# Patient Record
Sex: Female | Born: 1995 | Race: White | Hispanic: No | Marital: Single | Smoking: Never smoker
Health system: Southern US, Community
[De-identification: ages and names within clinical notes are randomized; demographics above are authoritative.]

## PROBLEM LIST (undated history)

## (undated) DIAGNOSIS — Z789 Other specified health status: Secondary | ICD-10-CM

## (undated) HISTORY — PX: NO PAST SURGERIES: SHX2092

---
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
## 2014-02-05 ENCOUNTER — Encounter: Payer: Self-pay | Admitting: Podiatry

## 2014-02-05 ENCOUNTER — Ambulatory Visit (INDEPENDENT_AMBULATORY_CARE_PROVIDER_SITE_OTHER): Payer: BC Managed Care – PPO

## 2014-02-05 ENCOUNTER — Ambulatory Visit (INDEPENDENT_AMBULATORY_CARE_PROVIDER_SITE_OTHER): Payer: BC Managed Care – PPO | Admitting: Podiatry

## 2014-02-05 VITALS — BP 120/75 | HR 57 | Resp 14 | Ht 64.0 in | Wt 150.0 lb

## 2014-02-05 DIAGNOSIS — M722 Plantar fascial fibromatosis: Secondary | ICD-10-CM

## 2014-02-05 DIAGNOSIS — M79609 Pain in unspecified limb: Secondary | ICD-10-CM

## 2014-02-05 MED ORDER — TRIAMCINOLONE ACETONIDE 10 MG/ML IJ SUSP
10.0000 mg | Freq: Once | INTRAMUSCULAR | Status: AC
  Administered 2014-02-05: 10 mg

## 2014-02-05 MED ORDER — DICLOFENAC SODIUM 75 MG PO TBEC: 75.0000 mg | DELAYED_RELEASE_TABLET | Freq: Two times a day (BID) | ORAL | Status: DC

## 2014-02-05 NOTE — Progress Notes (Signed)
   Subjective:    Patient ID: Sherri Barton, female    DOB: [birth year], 18 y.o.   MRN: [id number]  HPI N arch pain        L left medial arch        D 4 to 5 months        O worsening        C shooting pain        A no known trigger        T rest, Ibuprofen, OTC inserts    Review of Systems  All other systems reviewed and are negative.       Objective:   Physical Exam        Assessment & Plan:

## 2014-02-05 NOTE — Patient Instructions (Signed)

## 2014-02-06 NOTE — Progress Notes (Signed)
Subjective:     Patient ID: Sherri Barton, female   DOB: [birth year], 18 y.o.   MRN: [id number]  Foot Pain   patient presents with mother stating that she has pain in her left arch that has been hurting her for about 4-5 months and gradually getting worse. Also states that she's flat-footed and that always given her trouble   Review of Systems  All other systems reviewed and are negative.       Objective:   Physical Exam  Nursing note and vitals reviewed. Constitutional: She is oriented to person, place, and time.  Cardiovascular: Intact distal pulses.   Musculoskeletal: Normal range of motion.  Neurological: She is oriented to person, place, and time.  Skin: Skin is warm.   neurovascular status is intact with muscle strength adequate and normal range of motion subtalar and midtarsal joint. Digits are well perfused and I noted there to be no equinus condition. Patient is found to have discomfort in the left mid arch area with inflammation and fluid occurring and is noted to have significant structural depression of the arch of both feet     Assessment:     Plantar fasciitis left with collapse medial longitudinal arch both feet    Plan:     H&P and x-rays reviewed. Explained condition and did careful injection mid arch area left 3 mg Kenalog 5 g I can Marcaine mixture and dispensed fascial brace. Due to young age and chronic problems we did go ahead today and scanned for custom orthotics to lift the arch. Patient will be seen back when orthotics are ready

## 2014-02-07 ENCOUNTER — Ambulatory Visit: Payer: Self-pay | Admitting: Podiatry

## 2014-02-19 ENCOUNTER — Ambulatory Visit (INDEPENDENT_AMBULATORY_CARE_PROVIDER_SITE_OTHER): Payer: BC Managed Care – PPO | Admitting: Podiatry

## 2014-02-19 ENCOUNTER — Encounter: Payer: Self-pay | Admitting: Podiatry

## 2014-02-19 VITALS — BP 120/76 | HR 60 | Resp 16

## 2014-02-19 DIAGNOSIS — M722 Plantar fascial fibromatosis: Secondary | ICD-10-CM

## 2014-02-19 NOTE — Patient Instructions (Signed)

## 2014-02-19 NOTE — Progress Notes (Signed)
Pick up orthotics , written and verbal instructions given

## 2014-02-19 NOTE — Progress Notes (Signed)
Subjective:     Patient ID: Sherri Barton, female   DOB: 1996-09-28, 18 y.o.   MRN: [id number]  HPI patient states that her feet are feeling much better with minimal discomfort or inflammation noted at the current time   Review of Systems     Objective:   Physical Exam Neurovascular status intact with inflammation of the left mid arch area with   mild discomfort when pressed deeply into the tissue Assessment:     Improved plantar fasciitis left    Plan:     Gave instructions on physical therapy and shoe gear modifications and dispensed orthotics with all instructions on usage. Reappoint in 2 weeks

## 2014-03-19 ENCOUNTER — Ambulatory Visit: Payer: BC Managed Care – PPO | Admitting: Podiatry

## 2014-03-21 ENCOUNTER — Encounter: Payer: Self-pay | Admitting: Podiatry

## 2014-03-21 ENCOUNTER — Ambulatory Visit (INDEPENDENT_AMBULATORY_CARE_PROVIDER_SITE_OTHER): Payer: BC Managed Care – PPO | Admitting: Podiatry

## 2014-03-21 VITALS — BP 119/69 | HR 67 | Resp 16

## 2014-03-21 DIAGNOSIS — M722 Plantar fascial fibromatosis: Secondary | ICD-10-CM

## 2014-03-21 NOTE — Progress Notes (Signed)
Subjective:     Patient ID: Sherri Barton, female   DOB: [birth year], 18 y.o.   MRN: [id number]  HPI patient states that the left foot is feeling better but if she's on it for long periods of time but still gets sore   Review of Systems     Objective:   Physical Exam Neurovascular status intact with discomfort in the left mid arch area that is still present but improved from previous    Assessment:     Plantar fasciitis left still present but improved    Plan:     Gave instructions on physical therapy anti-inflammatory continued orthotic usage and possible cast immobilization if symptoms persist.

## 2016-04-28 ENCOUNTER — Encounter (HOSPITAL_COMMUNITY): Payer: Self-pay | Admitting: *Deleted

## 2016-04-28 ENCOUNTER — Inpatient Hospital Stay (HOSPITAL_COMMUNITY): Payer: 59 | Admitting: Anesthesiology

## 2016-04-28 ENCOUNTER — Ambulatory Visit (HOSPITAL_COMMUNITY)
Admission: AD | Admit: 2016-04-28 | Discharge: 2016-04-28 | Disposition: A | Discharge status: Home / Self Care | Payer: 59 | Source: Ambulatory Visit | Attending: Obstetrics & Gynecology | Admitting: Obstetrics & Gynecology

## 2016-04-28 ENCOUNTER — Encounter (HOSPITAL_COMMUNITY)
Admission: AD | Disposition: A | Discharge status: Home / Self Care | Payer: Self-pay | Source: Ambulatory Visit | Attending: Obstetrics & Gynecology

## 2016-04-28 ENCOUNTER — Inpatient Hospital Stay (HOSPITAL_COMMUNITY): Payer: 59

## 2016-04-28 DIAGNOSIS — O26899 Other specified pregnancy related conditions, unspecified trimester: Secondary | ICD-10-CM

## 2016-04-28 DIAGNOSIS — O209 Hemorrhage in early pregnancy, unspecified: Secondary | ICD-10-CM | POA: Diagnosis present

## 2016-04-28 DIAGNOSIS — O00101 Right tubal pregnancy without intrauterine pregnancy: Secondary | ICD-10-CM

## 2016-04-28 DIAGNOSIS — O00109 Unspecified tubal pregnancy without intrauterine pregnancy: Secondary | ICD-10-CM

## 2016-04-28 DIAGNOSIS — O001 Tubal pregnancy without intrauterine pregnancy: Secondary | ICD-10-CM

## 2016-04-28 DIAGNOSIS — K661 Hemoperitoneum: Secondary | ICD-10-CM

## 2016-04-28 DIAGNOSIS — Z880 Allergy status to penicillin: Secondary | ICD-10-CM | POA: Insufficient documentation

## 2016-04-28 DIAGNOSIS — R109 Unspecified abdominal pain: Secondary | ICD-10-CM

## 2016-04-28 HISTORY — PX: DIAGNOSTIC LAPAROSCOPY WITH REMOVAL OF ECTOPIC PREGNANCY: SHX6449

## 2016-04-28 HISTORY — PX: LAPAROSCOPIC UNILATERAL SALPINGECTOMY: SHX5934

## 2016-04-28 HISTORY — DX: Hemoperitoneum: K66.1

## 2016-04-28 HISTORY — DX: Other specified health status: Z78.9

## 2016-04-28 LAB — HIV ANTIBODY (ROUTINE TESTING W REFLEX): HIV Screen 4th Generation wRfx: NONREACTIVE

## 2016-04-28 LAB — URINALYSIS, ROUTINE W REFLEX MICROSCOPIC
Bilirubin Urine: NEGATIVE
GLUCOSE, UA: NEGATIVE mg/dL
Ketones, ur: NEGATIVE mg/dL
Nitrite: NEGATIVE
PH: 6 (ref 5.0–8.0)
Protein, ur: NEGATIVE mg/dL
Specific Gravity, Urine: 1.025 (ref 1.005–1.030)

## 2016-04-28 LAB — HCG, QUANTITATIVE, PREGNANCY: hCG, Beta Chain, Quant, S: 2471 m[IU]/mL — ABNORMAL HIGH (ref <5)

## 2016-04-28 LAB — WET PREP, GENITAL
Sperm: NONE SEEN
TRICH WET PREP: NONE SEEN
YEAST WET PREP: NONE SEEN

## 2016-04-28 LAB — POCT PREGNANCY, URINE: Preg Test, Ur: POSITIVE — AB

## 2016-04-28 LAB — CBC
HCT: 35 % — ABNORMAL LOW (ref 36.0–46.0)
Hemoglobin: 11.4 g/dL — ABNORMAL LOW (ref 12.0–15.0)
MCH: 26.6 pg (ref 26.0–34.0)
MCHC: 32.6 g/dL (ref 30.0–36.0)
MCV: 81.6 fL (ref 78.0–100.0)
PLATELETS: 193 10*3/uL (ref 150–400)
RBC: 4.29 MIL/uL (ref 3.87–5.11)
RDW: 14 % (ref 11.5–15.5)
WBC: 5.6 10*3/uL (ref 4.0–10.5)

## 2016-04-28 LAB — URINE MICROSCOPIC-ADD ON

## 2016-04-28 LAB — ABO/RH: ABO/RH(D): A POS

## 2016-04-28 SURGERY — LAPAROSCOPY, WITH ECTOPIC PREGNANCY SURGICAL TREATMENT
Anesthesia: General | Laterality: Right

## 2016-04-28 MED ORDER — LIDOCAINE HCL (CARDIAC) 20 MG/ML IV SOLN
INTRAVENOUS | Status: AC
Start: 2016-04-28 — End: 2016-04-28
  Filled 2016-04-28: qty 5

## 2016-04-28 MED ORDER — SCOPOLAMINE 1 MG/3DAYS TD PT72
MEDICATED_PATCH | TRANSDERMAL | Status: AC
  Filled 2016-04-28: qty 1

## 2016-04-28 MED ORDER — HYDROMORPHONE HCL 1 MG/ML IJ SOLN
0.2500 mg | INTRAMUSCULAR | Status: DC | PRN
  Administered 2016-04-28: 0.25 mg via INTRAVENOUS

## 2016-04-28 MED ORDER — PROPOFOL 10 MG/ML IV BOLUS
INTRAVENOUS | Status: DC | PRN
  Administered 2016-04-28: 150 mg via INTRAVENOUS

## 2016-04-28 MED ORDER — ROCURONIUM BROMIDE 100 MG/10ML IV SOLN
INTRAVENOUS | Status: DC | PRN
  Administered 2016-04-28: 30 mg via INTRAVENOUS

## 2016-04-28 MED ORDER — MIDAZOLAM HCL 2 MG/2ML IJ SOLN
INTRAMUSCULAR | Status: AC
Start: 2016-04-28 — End: 2016-04-28
  Filled 2016-04-28: qty 2

## 2016-04-28 MED ORDER — LACTATED RINGERS IV SOLN
INTRAVENOUS | Status: DC
  Administered 2016-04-28 (×2): via INTRAVENOUS

## 2016-04-28 MED ORDER — DEXAMETHASONE SODIUM PHOSPHATE 10 MG/ML IJ SOLN
INTRAMUSCULAR | Status: AC
  Filled 2016-04-28: qty 1

## 2016-04-28 MED ORDER — LIDOCAINE HCL (CARDIAC) 20 MG/ML IV SOLN
INTRAVENOUS | Status: DC | PRN
  Administered 2016-04-28: 60 mg via INTRAVENOUS

## 2016-04-28 MED ORDER — FENTANYL CITRATE (PF) 250 MCG/5ML IJ SOLN
INTRAMUSCULAR | Status: DC | PRN
  Administered 2016-04-28: 100 ug via INTRAVENOUS
  Administered 2016-04-28 (×2): 50 ug via INTRAVENOUS

## 2016-04-28 MED ORDER — SUCCINYLCHOLINE CHLORIDE 20 MG/ML IJ SOLN
INTRAMUSCULAR | Status: DC | PRN
  Administered 2016-04-28: 100 mg via INTRAVENOUS

## 2016-04-28 MED ORDER — NEOSTIGMINE METHYLSULFATE 10 MG/10ML IV SOLN
INTRAVENOUS | Status: AC
  Filled 2016-04-28: qty 1

## 2016-04-28 MED ORDER — PROPOFOL 10 MG/ML IV BOLUS
INTRAVENOUS | Status: AC
  Filled 2016-04-28: qty 20

## 2016-04-28 MED ORDER — HYDROCODONE-ACETAMINOPHEN 7.5-325 MG PO TABS: 1.0000 | ORAL_TABLET | Freq: Once | ORAL | Status: DC | PRN

## 2016-04-28 MED ORDER — FENTANYL CITRATE (PF) 100 MCG/2ML IJ SOLN: INTRAMUSCULAR | Status: DC | PRN

## 2016-04-28 MED ORDER — OXYCODONE-ACETAMINOPHEN 5-325 MG PO TABS: 1.0000 | ORAL_TABLET | Freq: Four times a day (QID) | ORAL | Status: DC | PRN

## 2016-04-28 MED ORDER — GLYCOPYRROLATE 0.2 MG/ML IJ SOLN
INTRAMUSCULAR | Status: AC
  Filled 2016-04-28: qty 4

## 2016-04-28 MED ORDER — ONDANSETRON HCL 4 MG/2ML IJ SOLN
INTRAMUSCULAR | Status: AC
  Filled 2016-04-28: qty 2

## 2016-04-28 MED ORDER — DEXAMETHASONE SODIUM PHOSPHATE 4 MG/ML IJ SOLN
INTRAMUSCULAR | Status: DC | PRN
  Administered 2016-04-28: 10 mg via INTRAVENOUS

## 2016-04-28 MED ORDER — HYDROMORPHONE HCL 1 MG/ML IJ SOLN
INTRAMUSCULAR | Status: DC | PRN
  Administered 2016-04-28: 1 mg via INTRAVENOUS

## 2016-04-28 MED ORDER — HYDROMORPHONE HCL 1 MG/ML IJ SOLN
1.0000 mg | Freq: Once | INTRAMUSCULAR | Status: AC
  Administered 2016-04-28: 1 mg via INTRAMUSCULAR
  Filled 2016-04-28: qty 1

## 2016-04-28 MED ORDER — PROMETHAZINE HCL 25 MG/ML IJ SOLN: 6.2500 mg | INTRAMUSCULAR | Status: DC | PRN

## 2016-04-28 MED ORDER — SOD CITRATE-CITRIC ACID 500-334 MG/5ML PO SOLN
30.0000 mL | ORAL | Status: AC
  Administered 2016-04-28: 30 mL via ORAL

## 2016-04-28 MED ORDER — BUPIVACAINE HCL (PF) 0.25 % IJ SOLN
INTRAMUSCULAR | Status: DC | PRN
  Administered 2016-04-28: 10 mL

## 2016-04-28 MED ORDER — ROCURONIUM BROMIDE 100 MG/10ML IV SOLN
INTRAVENOUS | Status: AC
  Filled 2016-04-28: qty 1

## 2016-04-28 MED ORDER — KETOROLAC TROMETHAMINE 30 MG/ML IJ SOLN: 30.0000 mg | Freq: Once | INTRAMUSCULAR | Status: DC | PRN

## 2016-04-28 MED ORDER — HYDROMORPHONE HCL 1 MG/ML IJ SOLN
INTRAMUSCULAR | Status: AC
  Filled 2016-04-28: qty 1

## 2016-04-28 MED ORDER — IBUPROFEN 600 MG PO TABS: 600.0000 mg | ORAL_TABLET | Freq: Four times a day (QID) | ORAL | Status: DC | PRN

## 2016-04-28 MED ORDER — SCOPOLAMINE 1 MG/3DAYS TD PT72
MEDICATED_PATCH | TRANSDERMAL | Status: DC | PRN
  Administered 2016-04-28: 1 via TRANSDERMAL

## 2016-04-28 MED ORDER — GLYCOPYRROLATE 0.2 MG/ML IJ SOLN
INTRAMUSCULAR | Status: DC | PRN
  Administered 2016-04-28: 0.1 mg via INTRAVENOUS
  Administered 2016-04-28: .8 mg via INTRAVENOUS

## 2016-04-28 MED ORDER — SOD CITRATE-CITRIC ACID 500-334 MG/5ML PO SOLN
ORAL | Status: AC
  Filled 2016-04-28: qty 15

## 2016-04-28 MED ORDER — FENTANYL CITRATE (PF) 250 MCG/5ML IJ SOLN
INTRAMUSCULAR | Status: AC
  Filled 2016-04-28: qty 5

## 2016-04-28 MED ORDER — MIDAZOLAM HCL 2 MG/2ML IJ SOLN
INTRAMUSCULAR | Status: DC | PRN
  Administered 2016-04-28: 2 mg via INTRAVENOUS

## 2016-04-28 MED ORDER — NEOSTIGMINE METHYLSULFATE 10 MG/10ML IV SOLN
INTRAVENOUS | Status: DC | PRN
  Administered 2016-04-28: 4 mg via INTRAVENOUS

## 2016-04-28 SURGICAL SUPPLY — 27 items
CABLE HIGH FREQUENCY MONO STRZ (ELECTRODE) IMPLANT
CLOSURE WOUND 1/2 X4 (GAUZE/BANDAGES/DRESSINGS)
CLOTH BEACON ORANGE TIMEOUT ST (SAFETY) ×5 IMPLANT
DRSG COVADERM PLUS 2X2 (GAUZE/BANDAGES/DRESSINGS) ×4 IMPLANT
DRSG OPSITE POSTOP 3X4 (GAUZE/BANDAGES/DRESSINGS) ×6 IMPLANT
DURAPREP 26ML APPLICATOR (WOUND CARE) ×5 IMPLANT
GLOVE BIO SURGEON STRL SZ 6.5 (GLOVE) ×4 IMPLANT
GLOVE BIO SURGEONS STRL SZ 6.5 (GLOVE) ×1
GLOVE BIOGEL PI IND STRL 7.0 (GLOVE) ×8 IMPLANT
GLOVE BIOGEL PI INDICATOR 7.0 (GLOVE) ×8
GOWN STRL REUS W/TWL LRG LVL3 (GOWN DISPOSABLE) ×10 IMPLANT
LIQUID BAND (GAUZE/BANDAGES/DRESSINGS) ×3 IMPLANT
NEEDLE INSUFFLATION 120MM (ENDOMECHANICALS) ×5 IMPLANT
NS IRRIG 1000ML POUR BTL (IV SOLUTION) ×5 IMPLANT
PACK LAPAROSCOPY BASIN (CUSTOM PROCEDURE TRAY) ×5 IMPLANT
PAD TRENDELENBURG POSITION (MISCELLANEOUS) ×5 IMPLANT
SET IRRIG TUBING LAPAROSCOPIC (IRRIGATION / IRRIGATOR) ×3 IMPLANT
SHEARS HARMONIC ACE PLUS 36CM (ENDOMECHANICALS) ×3 IMPLANT
SLEEVE XCEL OPT CAN 5 100 (ENDOMECHANICALS) ×5 IMPLANT
STRIP CLOSURE SKIN 1/2X4 (GAUZE/BANDAGES/DRESSINGS) IMPLANT
SUT VICRYL 0 UR6 27IN ABS (SUTURE) ×5 IMPLANT
SUT VICRYL 4-0 PS2 18IN ABS (SUTURE) ×5 IMPLANT
TOWEL OR 17X24 6PK STRL BLUE (TOWEL DISPOSABLE) ×10 IMPLANT
TRAY FOLEY BAG SILVER LF 16FR (SET/KITS/TRAYS/PACK) ×5 IMPLANT
TROCAR XCEL DIL TIP R 11M (ENDOMECHANICALS) ×5 IMPLANT
WARMER LAPAROSCOPE (MISCELLANEOUS) ×5 IMPLANT
WATER STERILE IRR 1000ML POUR (IV SOLUTION) ×2 IMPLANT

## 2016-04-28 NOTE — Anesthesia Postprocedure Evaluation (Signed)
Anesthesia Post Note  Patient: Sherri Barton  Procedure(s) Performed: Procedure(s) (LRB): DIAGNOSTIC LAPAROSCOPY WITH REMOVAL OF RIGHT ECTOPIC PREGNANCY (Right) LAPAROSCOPIC RIGHT SALPINGECTOMY  Patient location during evaluation: PACU Anesthesia Type: General Level of consciousness: awake and alert and oriented Pain management: pain level controlled Vital Signs Assessment: post-procedure vital signs reviewed and stable Respiratory status: spontaneous breathing, nonlabored ventilation and respiratory function stable Cardiovascular status: blood pressure returned to baseline and stable Postop Assessment: no signs of nausea or vomiting Anesthetic complications: no     Last Vitals:  Filed Vitals:   04/28/16 1300 04/28/16 1315  BP: 122/63 115/62  Pulse: 72 65  Temp:    Resp: 20 15    Last Pain:  Filed Vitals:   04/28/16 1319  PainSc: 2    Pain Goal: Patients Stated Pain Goal: 3 (04/28/16 1315)               Edwardine Deschepper A.

## 2016-04-28 NOTE — Op Note (Signed)
Sherri Barton PROCEDURE DATE: 04/28/2016  PREOPERATIVE DIAGNOSIS: Ruptured ectopic pregnancy POSTOPERATIVE DIAGNOSIS: Ruptured right fallopian tube ectopic pregnancy PROCEDURE: Laparoscopic right salpingectomy and removal of ectopic pregnancy SURGEON:  Adam PhenixJames G Arnold, MD  ANESTHESIOLOGIST: Marcene Duosobert Fitzgerald, MD Anesthesiologist: Marcene Duosobert Fitzgerald, MD CRNA: Graciela HusbandsWynn O Fussell, CRNA  INDICATIONS: 20 y.o. G1P0 at 59107w2d here with the preoperative diagnoses as listed above.  Please refer to preoperative notes for more details. Patient was counseled regarding need for laparoscopic salpingectomy. Risks of surgery including bleeding which may require transfusion or reoperation, infection, injury to bowel or other surrounding organs, need for additional procedures including laparotomy and other postoperative/anesthesia complications were explained to patient.  Written informed consent was obtained.  FINDINGS:  moderate amount of hemoperitoneum estimated to be about 100 of blood and clots.  Dilated right fallopian tube containing ectopic gestation. Small normal appearing uterus, normal left fallopian tube, right ovary and left ovary.  ANESTHESIA: General INTRAVENOUS FLUIDS: 1500 ml ESTIMATED BLOOD LOSS: 100 ml URINE OUTPUT: 100 ml SPECIMENS: Right fallopian tube containing ectopic gestation COMPLICATIONS: None immediate  PROCEDURE IN DETAIL:  The patient was taken to the operating room where general anesthesia was administered and was found to be adequate.  She was placed in the dorsal lithotomy position, and was prepped and draped in a sterile manner.  A Foley catheter was inserted into her bladder and attached to constant drainage and a uterine manipulator was then advanced into the uterus .    After an adequate timeout was performed, attention was turned to the abdomen where an umbilical incision was made with the scalpel.  The Optiview 11-mm trocar and sleeve were then advanced without difficulty with  the laparoscope under direct visualization into the abdomen.  The abdomen was then insufflated with carbon dioxide gas and adequate pneumoperitoneum was obtained.  A survey of the patient's pelvis and abdomen revealed the findings above.  Two 5-mm left lower quadrant ports were then placed under direct visualization.  The Nezhat suction irrigator was then used to suction the hemoperitoneum and irrigate the pelvis.  The Swollen right fallopina tube was opened with the harmonic scalpel and clot and possible tissue was removed and harvested with the Endocatch. The tube was inspected and continued bleeding was seen and I could not confirm that all tissue was removed. Attention was then turned to the right fallopian tube which was grasped and ligated from the underlying mesosalpinx and uterine attachment using the Harmonic instrument.  Good hemostasis was noted.  The specimen was placed in an EndoCatch bag and removed from the abdomen intact.  The abdomen was desufflated, and all instruments were removed.  The fascial incision of the 10-mm site was reapproximated with a 0 Vicryl figure-of-eight stitch and all skin incisions were closed with Dermabond. The patient tolerated the procedure well.  All instruments, needles, and sponge counts were correct x 2. The patient was taken to the recovery room in stable condition.   The patient will be discharged to home as per PACU criteria.  Routine postoperative instructions given.  She was prescribed Percocet, Ibuprofen.  She will follow up in the clinic in about 2-3 weeks for postoperative evaluation.   Adam PhenixJames G Arnold, MD 04/28/2016 12:36 PM

## 2016-04-28 NOTE — MAU Provider Note (Signed)
History     CSN: [id number]  Arrival date and time: 04/28/16 8295   First Provider Initiated Contact with Patient 04/28/16 [phone]       Chief Complaint  Patient presents with  . Vaginal Bleeding  . Abdominal Cramping   HPI  Sherri Barton is a 20 y.o. G1P0 at [redacted]w[redacted]d by LMP who presents with abdominal pain & vaginal bleeding. Reports lower abdominal pain x 5 days; worse this morning. Rates pain 8/10. No treatment.  Vaginal bleeding x 2 weeks. States it's more than spotting but not as heavy as menses.  Some nausea, no vomiting.  Denies fever, dysuria, vaginal discharge, diarrhea, or constipation.   OB History    Gravida Para Term Preterm AB TAB SAB Ectopic Multiple Living   1               Past Medical History  Diagnosis Date  . Medical history non-contributory     Past Surgical History  Procedure Laterality Date  . No past surgeries      History reviewed. No pertinent family history.  Social History  Substance Use Topics  . Smoking status: Never Smoker   . Smokeless tobacco: Never Used  . Alcohol Use: No    Allergies:  Allergies  Allergen Reactions  . Augmentin [Amoxicillin-Pot Clavulanate] Rash    Prescriptions prior to admission  Medication Sig Dispense Refill Last Dose  . diclofenac (VOLTAREN) 75 MG EC tablet Take 1 tablet (75 mg total) by mouth 2 (two) times daily. 50 tablet 2   . Norgestimate-Ethinyl Estradiol Triphasic (TRI-PREVIFEM) 0.18/0.215/0.25 MG-35 MCG tablet Take 1 tablet by mouth daily.   Taking    Review of Systems  Constitutional: Negative.   Gastrointestinal: Positive for nausea and abdominal pain. Negative for vomiting, diarrhea and constipation.  Genitourinary: Negative for dysuria.       + vaginal bleeding  Neurological: Negative for dizziness.   Physical Exam   Blood pressure 121/73, pulse 95, temperature 98 F (36.7 C), temperature source Oral, resp. rate 18, height 5' 4.5" (1.638 m), weight 174 lb 9.6 oz (79.198 kg), last  menstrual period 03/08/2016.  Physical Exam  Nursing note and vitals reviewed. Constitutional: She is oriented to person, place, and time. She appears well-developed and well-nourished. No distress.  HENT:  Head: Normocephalic and atraumatic.  Eyes: Conjunctivae are normal. Right eye exhibits no discharge. Left eye exhibits no discharge. No scleral icterus.  Neck: Normal range of motion.  Cardiovascular: Normal rate, regular rhythm and normal heart sounds.   No murmur heard. Respiratory: Effort normal and breath sounds normal. No respiratory distress. She has no wheezes.  GI: Soft. There is tenderness in the suprapubic area. There is no rigidity and no rebound.  Genitourinary: Uterus is tender. Cervix exhibits no motion tenderness. There is bleeding (minimal amount of dark red blood) in the vagina.  Cervix closed  Neurological: She is alert and oriented to person, place, and time.  Skin: Skin is warm and dry. She is not diaphoretic.  Psychiatric: She has a normal mood and affect. Her behavior is normal. Judgment and thought content normal.    MAU Course  Procedures Results for orders placed or performed during the hospital encounter of 04/28/16 (from the past 24 hour(s))  Urinalysis, Routine w reflex microscopic (not at [facility])     Status: Abnormal   Collection Time: 04/28/16  7:55 AM  Result Value Ref Range   Color, Urine YELLOW YELLOW   APPearance CLEAR CLEAR   Specific Gravity,  Urine 1.025 1.005 - 1.030   pH 6.0 5.0 - 8.0   Glucose, UA NEGATIVE NEGATIVE mg/dL   Hgb urine dipstick LARGE (A) NEGATIVE   Bilirubin Urine NEGATIVE NEGATIVE   Ketones, ur NEGATIVE NEGATIVE mg/dL   Protein, ur NEGATIVE NEGATIVE mg/dL   Nitrite NEGATIVE NEGATIVE   Leukocytes, UA TRACE (A) NEGATIVE  Urine microscopic-add on     Status: Abnormal   Collection Time: 04/28/16  7:55 AM  Result Value Ref Range   Squamous Epithelial / LPF 0-5 (A) NONE SEEN   WBC, UA 0-5 0 - 5 WBC/hpf   RBC / HPF 0-5 0 - 5  RBC/hpf   Bacteria, UA MANY (A) NONE SEEN   Urine-Other LESS THAN 10 mL OF URINE SUBMITTED   Pregnancy, urine POC     Status: Abnormal   Collection Time: 04/28/16  7:57 AM  Result Value Ref Range   Preg Test, Ur POSITIVE (A) NEGATIVE  CBC     Status: Abnormal   Collection Time: 04/28/16  8:39 AM  Result Value Ref Range   WBC 5.6 4.0 - 10.5 K/uL   RBC 4.29 3.87 - 5.11 MIL/uL   Hemoglobin 11.4 (L) 12.0 - 15.0 g/dL   HCT 16.1 (L) 09.6 - 04.5 %   MCV 81.6 78.0 - 100.0 fL   MCH 26.6 26.0 - 34.0 pg   MCHC 32.6 30.0 - 36.0 g/dL   RDW 40.9 81.1 - 91.4 %   Platelets 193 150 - 400 K/uL  ABO/Rh     Status: None (Preliminary result)   Collection Time: 04/28/16  8:39 AM  Result Value Ref Range   ABO/RH(D) A POS   hCG, quantitative, pregnancy     Status: Abnormal   Collection Time: 04/28/16  8:40 AM  Result Value Ref Range   hCG, Beta Chain, Quant, S 2471 (H) <5 mIU/mL  Wet prep, genital     Status: Abnormal   Collection Time: 04/28/16  8:43 AM  Result Value Ref Range   Yeast Wet Prep HPF POC NONE SEEN NONE SEEN   Trich, Wet Prep NONE SEEN NONE SEEN   Clue Cells Wet Prep HPF POC PRESENT (A) NONE SEEN   WBC, Wet Prep HPF POC FEW (A) NONE SEEN   Sperm NONE SEEN    US Ob Comp Less 14 Wks  04/28/2016  CLINICAL DATA:  BILATERAL abdominal pain and light bleeding for 9 days, increasing. History of diabetes mellitus, hypertension, abdominal pain and rectal pregnancy ; EGA [redacted] weeks 2 days by LMP of 03/08/2016. No quantitative hCG yet available for correlation EXAM: OBSTETRIC <14 WK [place] AND TRANSVAGINAL OB US TECHNIQUE: Both transabdominal and transvaginal ultrasound examinations were performed for complete evaluation of the gestation as well as the maternal uterus, adnexal regions, and pelvic cul-de-sac. Transvaginal technique was performed to assess early pregnancy. COMPARISON:  None FINDINGS: Intrauterine gestational sac: Not identified Yolk sac:  N/A Embryo:  N/A Cardiac Activity: N/A Heart Rate: N/A   bpm Subchorionic hemorrhage:  N/A Maternal uterus/adnexae: Uterus normal appearance without mass or fluid collection. Heterogeneity of endometrial complex without identification by gestational sac or discrete fluid. Moderate free pelvic fluid, minimally complicated in appearance. LEFT ovary normal size and morphology, 2.9 x 2.7 x 2.0 cm. RIGHT ovary normal size and morphology 3.9 x 2.0 x 2.6 cm Superior to and separate from RIGHT ovary is a heterogeneous mass 4.8 x 2.8 x 3.2 cm in size cannot exclude ectopic pregnancy. No additional pelvic masses. IMPRESSION: No intrauterine gestation  identified. 4.8 x 2.8 x 3.2 cm diameter heterogeneous mass in RIGHT adnexa separate from the RIGHT ovary, cannot exclude ectopic pregnancy. Findings called to Kasim Mccorkle NP in MAU on 04/28/2016 at 0954 hours. Electronically Signed   Judeth HornBy: Ulyses SouthwardMark  Boles M.D.   On: 04/28/2016 09:57   [place] Ob Transvaginal  04/28/2016  CLINICAL DATA:  BILATERAL abdominal pain and light bleeding for 9 days, increasing. History of diabetes mellitus, hypertension, abdominal pain and rectal pregnancy ; EGA [redacted] weeks 2 days by LMP of 03/08/2016. No quantitative hCG yet available for correlation EXAM: OBSTETRIC <14 WK US AND TRANSVAGINAL OB US TECHNIQUE: Both transabdominal and transvaginal ultrasound examinations were performed for complete evaluation of the gestation as well as the maternal uterus, adnexal regions, and pelvic cul-de-sac. Transvaginal technique was performed to assess early pregnancy. COMPARISON:  None FINDINGS: Intrauterine gestational sac: Not identified Yolk sac:  N/A Embryo:  N/A Cardiac Activity: N/A Heart Rate: N/A  bpm Subchorionic hemorrhage:  N/A Maternal uterus/adnexae: Uterus normal appearance without mass or fluid collection. Heterogeneity of endometrial complex without identification by gestational sac or discrete fluid. Moderate free pelvic fluid, minimally complicated in appearance. LEFT ovary normal size and morphology, 2.9 x 2.7  x 2.0 cm. RIGHT ovary normal size and morphology 3.9 x 2.0 x 2.6 cm Superior to and separate from RIGHT ovary is a heterogeneous mass 4.8 x 2.8 x 3.2 cm in size cannot exclude ectopic pregnancy. No additional pelvic masses. IMPRESSION: No intrauterine gestation identified. 4.8 x 2.8 x 3.2 cm diameter heterogeneous mass in RIGHT adnexa separate from the RIGHT ovary, cannot exclude ectopic pregnancy. Findings called to Judeth HornErin Luisana Lutzke NP in MAU on 04/28/2016 at 0954 hours. Electronically Signed   By: Ulyses SouthwardMark  Boles M.D.   On: 04/28/2016 09:57    MDM UPT positive A positive CBC, BHCG, HIV, GC/CT, wet prep Dilaudid 1 mg IM given prior to going to ultrasound Ultrasound -- no IUP, right adnexal mass measuring 4.8 cm, moderate free fluid BHCG 2471 Discussed results with Dr. Debroah LoopArnold who will come see patient & discuss treatment plan  Assessment and Plan  A:  1. Tubal pregnancy without intrauterine pregnancy   2. Abdominal pain affecting pregnancy      Judeth Hornrin Nature Vogelsang 04/28/2016, 8:10 AM

## 2016-04-28 NOTE — Transfer of Care (Signed)
Immediate Anesthesia Transfer of Care Note  Patient: Sherri Barton  Procedure(s) Performed: Procedure(s): DIAGNOSTIC LAPAROSCOPY WITH REMOVAL OF RIGHT ECTOPIC PREGNANCY (Right) LAPAROSCOPIC RIGHT SALPINGECTOMY  Patient Location: PACU  Anesthesia Type:General  Level of Consciousness: awake, alert  and oriented  Airway & Oxygen Therapy: Patient Spontanous Breathing and Patient connected to nasal cannula oxygen  Post-op Assessment: Report given to RN and Post -op Vital signs reviewed and stable  Post vital signs: Reviewed and stable  Last Vitals:  Filed Vitals:   04/28/16 0748  BP: 121/73  Pulse: 95  Temp: 36.7 C  Resp: 18    Last Pain:  Filed Vitals:   04/28/16 0805  PainSc: 7          Complications: No apparent anesthesia complications

## 2016-04-28 NOTE — Discharge Instructions (Signed)
Ruptured Ectopic Pregnancy An ectopic pregnancy is when the fertilized egg attaches (implants) outside the uterus. Most ectopic pregnancies occur in the fallopian tube. Rarely do ectopic pregnancies occur on the ovary, intestine, pelvis, or cervix. An ectopic pregnancy does not have the ability to develop into a normal, healthy baby.  A ruptured ectopic pregnancy is one in which the fallopian tube gets torn or bursts and results in internal bleeding. Often there is intense abdominal pain, and sometimes, vaginal bleeding. Having an ectopic pregnancy can be a life-threatening experience. If left untreated, this dangerous condition can lead to a blood transfusion, abdominal surgery, or even death.  CAUSES  Damage to the fallopian tubes is the suspected cause in most ectopic pregnancies.  RISK FACTORS  Salpingectomy, Care After Refer to this sheet in the next few weeks. These instructions provide you with information on caring for yourself after your procedure. Your health care provider may also give you more specific instructions. Your treatment has been planned according to current medical practices, but problems sometimes occur. Call your health care provider if you have any problems or questions after your procedure. WHAT TO EXPECT AFTER THE PROCEDURE After your procedure, it is typical to have the following: Abdominal pain that can be controlled with pain medicine. Vaginal spotting. Tiredness. HOME CARE INSTRUCTIONS Get plenty of rest and sleep. Only take over-the-counter or prescription medicines as directed by your health care provider. Keep incision areas clean and dry. Remove or change any bandages (dressings) only as directed by your health care provider. You may resume your regular diet. Eat a well-balanced diet. Drink enough fluids to keep your urine clear or pale yellow. Limit exercise and activities as directed by your health care provider. Do not lift anything heavier than 5 lb (2.3  kg) until your health care provider approves. Do not drive until your health care provider approves. Do not have sexual intercourse until your health care provider says it is okay. Take your temperature twice a day for the first week. Write those temperatures down. Follow up with your health care provider as directed. SEEK MEDICAL CARE IF: You have pain when you urinate. You see pus coming out of the incision, or the incision is separating. You have increasing abdominal pain. You have swelling or redness in the incision area. You develop a rash. You feel lightheaded. You have pain that is not controlled with medicine. SEEK IMMEDIATE MEDICAL CARE IF: You develop a fever. You have increasing abdominal pain. You develop chest or leg pain. You develop shortness of breath. You pass out.   This information is not intended to replace advice given to you by your health care provider. Make sure you discuss any questions you have with your health care provider.   Document Released: 02/06/2011 Document Revised: 11/23/2014 Document Reviewed: 04/26/2013 Elsevier Interactive Patient Education [employer]2016 Elsevier Inc.  Depending on your circumstances, the amount of risk of having an ectopic pregnancy will vary. There are 3 categories that may help you identify whether you are potentially at risk. High Risk  You have gone through infertility treatment.  You have had a previous ectopic pregnancy.  You have had previous tubal surgery.  You have had previous surgery to have the fallopian tubes tied (tubal ligation).  You have tubal problems or diseases.  You have been exposed to DES. DES is a medicine that was used until 1971 and had effects on babies whose mothers took the medicine.  You become pregnant while using an intrauterine device (IUD)  for birth control. Moderate Risk  You have a history of infertility.  You have a history of a sexually transmitted infection (STI).  You have a  history of pelvic inflammatory disease (PID).  You have scarring from endometriosis.  You have multiple sexual partners.  You smoke. Low Risk  You have had previous pelvic surgery.  You use vaginal douching.  You became sexually active before 22 years of age. SYMPTOMS An ectopic pregnancy should be suspected in anyone who has missed a period and has abdominal pain or bleeding.  You may experience normal pregnancy symptoms, such as:  Nausea.  Tiredness.  Breast tenderness.  Symptoms that are not normal include:  Pain with intercourse.  Irregular vaginal bleeding or spotting.  Cramping or pain on one side, or in the lower abdomen.  Fast heartbeat.  Passing out while having a bowel movement.  Symptoms of a ruptured ectopic pregnancy and internal bleeding may include:  Sudden, severe pain in the abdomen and pelvis.  Dizziness or fainting.  Pain in the shoulder area. DIAGNOSIS  Tests that may be performed include:  A pregnancy test.  An ultrasound.  Testing the specific level of pregnancy hormone in the bloodstream.  Taking a sample of uterus tissue (dilation and curettage, D&C).  Surgery to perform a visual exam of the inside of the abdomen using a lighted tube (laparoscopy). TREATMENT  Laparoscopic surgery or abdominal surgery is recommended for a ruptured ectopic pregnancy.   The whole fallopian tube may need to be removed (salpingectomy).  If the tube is not too damaged, the tube may be saved, and the pregnancy will be surgically removed. Intime, the tube may still function.  If you have lost a lot of blood, you may need a blood transfusion.  You may receive a Rho (D) immune globulin shot if you are Rh negative and the father is Rh positive, or if you do not know the Rh type of the father. This is to prevent problems with any future pregnancy. SEEK IMMEDIATE MEDICAL CARE IF:  You have any symptoms of an ectopic or ruptured ectopic pregnancy. This is  a medical emergency. MAKE SURE YOU:  Understand these instructions.  Will watch your condition.  Will get help right away if you are not doing well or get worse.   This information is not intended to replace advice given to you by your health care provider. Make sure you discuss any questions you have with your health care provider.   Document Released: 10/30/2000 Document Revised: 11/07/2013 Document Reviewed: 08/14/2013 Elsevier Interactive Patient Education 2016 Elsevier Inc.  DISCHARGE INSTRUCTIONS: Laparoscopy  The following instructions have been prepared to help you care for yourself upon your return home today.  Wound care:  Do not get the incision wet for the first 24 hours. The incision should be kept clean and dry.  The Band-Aids or dressings may be removed the day after surgery.  Should the incision become sore, red, and swollen after the first week, check with your doctor.  Personal hygiene:  Shower the day after your procedure.  Activity and limitations:  Do NOT drive or operate any equipment today.  Do NOT lift anything more than 15 pounds for 2-3 weeks after surgery.  Do NOT rest in bed all day.  Walking is encouraged. Walk each day, starting slowly with 5-minute walks 3 or 4 times a day. Slowly increase the length of your walks.  Walk up and down stairs slowly.  Do NOT do strenuous activities,  such as golfing, playing tennis, bowling, running, biking, weight lifting, gardening, mowing, or vacuuming for 2-4 weeks. Ask your doctor when it is okay to start.  Diet: Eat a light meal as desired this evening. You may resume your usual diet tomorrow.  Return to work: This is dependent on the type of work you do. For the most part you can return to a desk job within a week of surgery. If you are more active at work, please discuss this with your doctor.  What to expect after your surgery: You may have a slight burning sensation when you urinate on the first  day. You may have a very small amount of blood in the urine. Expect to have a small amount of vaginal discharge/light bleeding for 1-2 weeks. It is not unusual to have abdominal soreness and bruising for up to 2 weeks. You may be tired and need more rest for about 1 week. You may experience shoulder pain for 24-72 hours. Lying flat in bed may relieve it.  You may remove the patch behind your left ear on or before 05/01/16. Wash your hands with soap and water after contact with the patch.  Call your doctor for any of the following:  Develop a fever of 100.4 or greater  Inability to urinate 6 hours after discharge from hospital  Severe pain not relieved by pain medications  Persistent of heavy bleeding at incision site  Redness or swelling around incision site after a week  Increasing nausea or vomiting  Patient Signature________________________________________ Nurse Signature_________________________________________

## 2016-04-28 NOTE — Anesthesia Procedure Notes (Signed)
Procedure Name: Intubation Date/Time: 04/28/2016 11:16 AM Performed by: Graciela HusbandsFUSSELL, Davon Folta O Pre-anesthesia Checklist: Patient identified, Timeout performed, Emergency Drugs available, Suction available and Patient being monitored Patient Re-evaluated:Patient Re-evaluated prior to inductionOxygen Delivery Method: Circle system utilized Preoxygenation: Pre-oxygenation with 100% oxygen Intubation Type: IV induction and Cricoid Pressure applied Ventilation: Mask ventilation without difficulty Laryngoscope Size: Mac and 3 Grade View: Grade I Tube type: Oral Tube size: 7.0 mm Number of attempts: 1 Airway Equipment and Method: Patient positioned with wedge pillow and Stylet Placement Confirmation: ETT inserted through vocal cords under direct vision,  positive ETCO2 and breath sounds checked- equal and bilateral Secured at: 21 cm Tube secured with: Tape Dental Injury: Teeth and Oropharynx as per pre-operative assessment

## 2016-04-28 NOTE — MAU Note (Signed)
Urine in lab 

## 2016-04-28 NOTE — Anesthesia Preprocedure Evaluation (Addendum)
Anesthesia Evaluation  Patient identified by MRN, date of birth, ID band Patient awake    Reviewed: Allergy & Precautions, NPO status , Patient's Chart, lab work & pertinent test results  Airway Mallampati: II  TM Distance: >3 FB Neck ROM: Full    Dental  (+) Dental Advisory Given   Pulmonary neg pulmonary ROS,    breath sounds clear to auscultation       Cardiovascular negative cardio ROS Normal cardiovascular exam Rhythm:Regular Rate:Normal     Neuro/Psych negative neurological ROS     GI/Hepatic negative GI ROS, Neg liver ROS,   Endo/Other  negative endocrine ROS  Renal/GU negative Renal ROS     Musculoskeletal   Abdominal   Peds  Hematology negative hematology ROS (+)   Anesthesia Other Findings   Reproductive/Obstetrics Ectopic pregnancy                           Lab Results  Component Value Date   WBC 5.6 04/28/2016   HGB 11.4* 04/28/2016   HCT 35.0* 04/28/2016   MCV 81.6 04/28/2016   PLT 193 04/28/2016   No results found for: CREATININE, BUN, NA, K, CL, CO2  Anesthesia Physical Anesthesia Plan  ASA: I and emergent  Anesthesia Plan: General   Post-op Pain Management:    Induction: Intravenous  Airway Management Planned: Oral ETT  Additional Equipment:   Intra-op Plan:   Post-operative Plan: Extubation in OR  Informed Consent: I have reviewed the patients History and Physical, chart, labs and discussed the procedure including the risks, benefits and alternatives for the proposed anesthesia with the patient or authorized representative who has indicated his/her understanding and acceptance.   Dental advisory given  Plan Discussed with: CRNA  Anesthesia Plan Comments:         Anesthesia Quick Evaluation

## 2016-04-28 NOTE — H&P (Signed)
History     CSN: [id number]  Arrival date and time: 04/28/16 7829  First Provider Initiated Contact with Patient 04/28/16 [phone]     Chief Complaint  Patient presents with  . Vaginal Bleeding  . Abdominal Cramping   HPI  Sherri Barton is a 20 y.o. G1P0 at [redacted]w[redacted]d by LMP who presents with abdominal pain & vaginal bleeding. Reports lower abdominal pain x 5 days; worse this morning. Rates pain 8/10. No treatment.  Vaginal bleeding x 2 weeks. States it's more than spotting but not as heavy as menses.  Some nausea, no vomiting.  Denies fever, dysuria, vaginal discharge, diarrhea, or constipation.   OB History    Gravida Para Term Preterm AB TAB SAB Ectopic Multiple Living   1               Past Medical History  Diagnosis Date  . Medical history non-contributory     Past Surgical History  Procedure Laterality Date  . No past surgeries      History reviewed. No pertinent family history.  Social History  Substance Use Topics  . Smoking status: Never Smoker   . Smokeless tobacco: Never Used  . Alcohol Use: No    Allergies:  Allergies  Allergen Reactions  . Augmentin [Amoxicillin-Pot Clavulanate] Rash    Prescriptions prior to admission  Medication Sig Dispense Refill Last Dose  . diclofenac (VOLTAREN) 75 MG EC tablet Take 1 tablet (75 mg total) by mouth 2 (two) times daily. 50 tablet 2   . Norgestimate-Ethinyl Estradiol Triphasic (TRI-PREVIFEM) 0.18/0.215/0.25 MG-35 MCG tablet Take 1 tablet by mouth daily.   Taking    Review of Systems  Constitutional: Negative.  Gastrointestinal: Positive for nausea and abdominal pain. Negative for vomiting, diarrhea and constipation.  Genitourinary: Negative for dysuria.   + vaginal bleeding  Neurological: Negative for dizziness.   Physical Exam   Blood pressure 121/73, pulse 95, temperature 98 F (36.7 C), temperature  source Oral, resp. rate 18, height 5' 4.5" (1.638 m), weight 174 lb 9.6 oz (79.198 kg), last menstrual period 03/08/2016.  Physical Exam  Nursing note and vitals reviewed. Constitutional: She is oriented to person, place, and time. She appears well-developed and well-nourished. No distress.  HENT:  Head: Normocephalic and atraumatic.  Eyes: Conjunctivae are normal. Right eye exhibits no discharge. Left eye exhibits no discharge. No scleral icterus.  Neck: Normal range of motion.  Cardiovascular: Normal rate, regular rhythm and normal heart sounds.  No murmur heard. Respiratory: Effort normal and breath sounds normal. No respiratory distress. She has no wheezes.  GI: Soft. There is tenderness in the suprapubic area. There is no rigidity and no rebound.  Genitourinary: Uterus is tender. Cervix exhibits no motion tenderness. There is bleeding (minimal amount of dark red blood) in the vagina.  Cervix closed  Neurological: She is alert and oriented to person, place, and time.  Skin: Skin is warm and dry. She is not diaphoretic.  Psychiatric: She has a normal mood and affect. Her behavior is normal. Judgment and thought content normal.    MAU Course  Procedures  Lab Results Last 24 Hours    Results for orders placed or performed during the hospital encounter of 04/28/16 (from the past 24 hour(s))  Urinalysis, Routine w reflex microscopic (not at [facility]) Status: Abnormal   Collection Time: 04/28/16 7:55 AM  Result Value Ref Range   Color, Urine YELLOW YELLOW   APPearance CLEAR CLEAR   Specific Gravity, Urine 1.025 1.005 - 1.030  pH 6.0 5.0 - 8.0   Glucose, UA NEGATIVE NEGATIVE mg/dL   Hgb urine dipstick LARGE (A) NEGATIVE   Bilirubin Urine NEGATIVE NEGATIVE   Ketones, ur NEGATIVE NEGATIVE mg/dL   Protein, ur NEGATIVE NEGATIVE mg/dL   Nitrite NEGATIVE NEGATIVE   Leukocytes, UA TRACE (A) NEGATIVE  Urine microscopic-add on  Status: Abnormal   Collection Time: 04/28/16 7:55 AM  Result Value Ref Range   Squamous Epithelial / LPF 0-5 (A) NONE SEEN   WBC, UA 0-5 0 - 5 WBC/hpf   RBC / HPF 0-5 0 - 5 RBC/hpf   Bacteria, UA MANY (A) NONE SEEN   Urine-Other LESS THAN 10 mL OF URINE SUBMITTED   Pregnancy, urine POC Status: Abnormal   Collection Time: 04/28/16 7:57 AM  Result Value Ref Range   Preg Test, Ur POSITIVE (A) NEGATIVE  CBC Status: Abnormal   Collection Time: 04/28/16 8:39 AM  Result Value Ref Range   WBC 5.6 4.0 - 10.5 K/uL   RBC 4.29 3.87 - 5.11 MIL/uL   Hemoglobin 11.4 (L) 12.0 - 15.0 g/dL   HCT 54.0 (L) 98.1 - 19.1 %   MCV 81.6 78.0 - 100.0 fL   MCH 26.6 26.0 - 34.0 pg   MCHC 32.6 30.0 - 36.0 g/dL   RDW 47.8 29.5 - 62.1 %   Platelets 193 150 - 400 K/uL  ABO/Rh Status: None (Preliminary result)   Collection Time: 04/28/16 8:39 AM  Result Value Ref Range   ABO/RH(D) A POS   hCG, quantitative, pregnancy Status: Abnormal   Collection Time: 04/28/16 8:40 AM  Result Value Ref Range   hCG, Beta Chain, Quant, S 2471 (H) <5 mIU/mL  Wet prep, genital Status: Abnormal   Collection Time: 04/28/16 8:43 AM  Result Value Ref Range   Yeast Wet Prep HPF POC NONE SEEN NONE SEEN   Trich, Wet Prep NONE SEEN NONE SEEN   Clue Cells Wet Prep HPF POC PRESENT (A) NONE SEEN   WBC, Wet Prep HPF POC FEW (A) NONE SEEN   Sperm NONE SEEN      US Ob Comp Less 14 Wks  04/28/2016 CLINICAL DATA: BILATERAL abdominal pain and light bleeding for 9 days, increasing. History of diabetes mellitus, hypertension, abdominal pain and rectal pregnancy ; EGA [redacted] weeks 2 days by LMP of 03/08/2016. No quantitative hCG yet available for correlation EXAM: OBSTETRIC <14 WK [place] AND TRANSVAGINAL OB US TECHNIQUE: Both transabdominal and transvaginal ultrasound examinations were performed for  complete evaluation of the gestation as well as the maternal uterus, adnexal regions, and pelvic cul-de-sac. Transvaginal technique was performed to assess early pregnancy. COMPARISON: None FINDINGS: Intrauterine gestational sac: Not identified Yolk sac: N/A Embryo: N/A Cardiac Activity: N/A Heart Rate: N/A bpm Subchorionic hemorrhage: N/A Maternal uterus/adnexae: Uterus normal appearance without mass or fluid collection. Heterogeneity of endometrial complex without identification by gestational sac or discrete fluid. Moderate free pelvic fluid, minimally complicated in appearance. LEFT ovary normal size and morphology, 2.9 x 2.7 x 2.0 cm. RIGHT ovary normal size and morphology 3.9 x 2.0 x 2.6 cm Superior to and separate from RIGHT ovary is a heterogeneous mass 4.8 x 2.8 x 3.2 cm in size cannot exclude ectopic pregnancy. No additional pelvic masses. IMPRESSION: No intrauterine gestation identified. 4.8 x 2.8 x 3.2 cm diameter heterogeneous mass in RIGHT adnexa separate from the RIGHT ovary, cannot exclude ectopic pregnancy. Findings called to Judeth Horn NP in MAU on 04/28/2016 at 0954 hours. Electronically Signed By: Ulyses Southward M.D. On:  04/28/2016 09:57   [place] Ob Transvaginal  04/28/2016 CLINICAL DATA: BILATERAL abdominal pain and light bleeding for 9 days, increasing. History of diabetes mellitus, hypertension, abdominal pain and rectal pregnancy ; EGA [redacted] weeks 2 days by LMP of 03/08/2016. No quantitative hCG yet available for correlation EXAM: OBSTETRIC <14 WK US AND TRANSVAGINAL OB US TECHNIQUE: Both transabdominal and transvaginal ultrasound examinations were performed for complete evaluation of the gestation as well as the maternal uterus, adnexal regions, and pelvic cul-de-sac. Transvaginal technique was performed to assess early pregnancy. COMPARISON: None FINDINGS: Intrauterine gestational sac: Not identified Yolk sac: N/A Embryo: N/A Cardiac Activity: N/A Heart Rate: N/A bpm Subchorionic  hemorrhage: N/A Maternal uterus/adnexae: Uterus normal appearance without mass or fluid collection. Heterogeneity of endometrial complex without identification by gestational sac or discrete fluid. Moderate free pelvic fluid, minimally complicated in appearance. LEFT ovary normal size and morphology, 2.9 x 2.7 x 2.0 cm. RIGHT ovary normal size and morphology 3.9 x 2.0 x 2.6 cm Superior to and separate from RIGHT ovary is a heterogeneous mass 4.8 x 2.8 x 3.2 cm in size cannot exclude ectopic pregnancy. No additional pelvic masses. IMPRESSION: No intrauterine gestation identified. 4.8 x 2.8 x 3.2 cm diameter heterogeneous mass in RIGHT adnexa separate from the RIGHT ovary, cannot exclude ectopic pregnancy. Findings called to Judeth HornErin Lawrence NP in MAU on 04/28/2016 at 0954 hours. Electronically Signed By: Ulyses SouthwardMark Boles M.D. On: 04/28/2016 09:57    MDM UPT positive A positive CBC, BHCG, HIV, GC/CT, wet prep Dilaudid 1 mg IM given prior to going to ultrasound Ultrasound -- no IUP, right adnexal mass measuring 4.8 cm, moderate free fluid BHCG 2471   Assessment and Plan  A: 1. Tubal pregnancy without intrauterine pregnancy   2. Abdominal pain affecting pregnancy   3. Size of ectopic, level of pain and presence of pelvic free fluid lend suspicion of rupture and that she is not a candidate for methotrexate Patient desires surgical management with laparoscopy and removal of ectopic pregnancy possible salpingectomy.  The risks of surgery were discussed in detail with the patient including but not limited to: bleeding which may require transfusion or reoperation; infection which may require prolonged hospitalization or re-hospitalization and antibiotic therapy; injury to bowel, bladder, ureters and major vessels or other surrounding organs; need for additional procedures including laparotomy; thromboembolic phenomenon, incisional problems and other postoperative or anesthesia complications.  Patient  was told that the likelihood that her condition and symptoms will be treated effectively with this surgical management was very high; the postoperative expectations were also discussed in detail. The patient also understands the alternative treatment options which were discussed in full. All questions were answered.   Adam PhenixJames G Cloyce Paterson, MD 04/28/2016 11:00 AM

## 2016-04-29 ENCOUNTER — Encounter (HOSPITAL_COMMUNITY): Payer: Self-pay | Admitting: Obstetrics & Gynecology

## 2016-04-29 LAB — GC/CHLAMYDIA PROBE AMP (~~LOC~~) NOT AT ARMC
Chlamydia: NEGATIVE
Neisseria Gonorrhea: NEGATIVE

## 2016-04-29 NOTE — Addendum Note (Signed)
Addendum  created 04/29/16 1004 by Randa SpikeMyra D Ambert Virrueta, CRNA   Modules edited: Charges VN

## 2016-05-25 ENCOUNTER — Ambulatory Visit (INDEPENDENT_AMBULATORY_CARE_PROVIDER_SITE_OTHER): Payer: 59 | Admitting: Obstetrics & Gynecology

## 2016-05-25 ENCOUNTER — Encounter: Payer: Self-pay | Admitting: Obstetrics & Gynecology

## 2016-05-25 VITALS — BP 105/41 | HR 51 | Resp 20 | Ht 64.0 in | Wt 173.2 lb

## 2016-05-25 DIAGNOSIS — Z9889 Other specified postprocedural states: Secondary | ICD-10-CM

## 2016-05-25 NOTE — Progress Notes (Signed)
Subjective:s/p laparoscopic right salpingectomy for ectopic     Sherri Barton is a 20 y.o. female who presents to the clinic 5 weeks status post laparoscopy and right salpingectomy for ectopic pregnancy. Eating a regular diet without difficulty. Bowel movements are normal. The patient is not having any pain.  The following portions of the patient's history were reviewed and updated as appropriate: allergies, current medications, past family history, past medical history, past social history, past surgical history and problem list.  Review of Systems Pertinent items are noted in HPI.    Objective:    BP 105/41 mmHg  Pulse 51  Resp 20  Ht 5\' 4"  (1.626 m)  Wt 173 lb 3.2 oz (78.563 kg)  BMI 29.72 kg/m2  LMP 04/28/2016 (Exact Date)  Breastfeeding? Unknown General:  alert, cooperative and no distress  Abdomen: soft, bowel sounds active, non-tender  Incision:   healing well, no drainage, no erythema, no hernia, no seroma, no swelling, no dehiscence, incision well approximated     Assessment:    Doing well postoperatively. Operative findings again reviewed. Pathology report discussed.    Plan:    1. Continue any current medications. 2. Wound care discussed. 3. Activity restrictions: none 4. Anticipated return to work: now. 5. Follow up: prn, no BCM requested  Adam PhenixJames G Alisha Bacus, MD 05/25/2016

## 2016-05-25 NOTE — Progress Notes (Signed)
Pt states she has had unprotected sex twice since her surgery for ectopic pregnancy.

## 2016-05-25 NOTE — Patient Instructions (Signed)
Intrauterine Device Information An intrauterine device (IUD) is inserted into your uterus to prevent pregnancy. There are two types of IUDs available:   Copper IUD--This type of IUD is wrapped in copper wire and is placed inside the uterus. Copper makes the uterus and fallopian tubes produce a fluid that kills sperm. The copper IUD can stay in place for 10 years.  Hormone IUD--This type of IUD contains the hormone progestin (synthetic progesterone). The hormone thickens the cervical mucus and prevents sperm from entering the uterus. It also thins the uterine lining to prevent implantation of a fertilized egg. The hormone can weaken or kill the sperm that get into the uterus. One type of hormone IUD can stay in place for 5 years, and another type can stay in place for 3 years. Your health care provider will make sure you are a good candidate for a contraceptive IUD. Discuss with your health care provider the possible side effects.  ADVANTAGES OF AN INTRAUTERINE DEVICE  IUDs are highly effective, reversible, long acting, and low maintenance.   There are no estrogen-related side effects.   An IUD can be used when breastfeeding.   IUDs are not associated with weight gain.   The copper IUD works immediately after insertion.   The hormone IUD works right away if inserted within 7 days of your period starting. You will need to use a backup method of birth control for 7 days if the hormone IUD is inserted at any other time in your cycle.  The copper IUD does not interfere with your female hormones.   The hormone IUD can make heavy menstrual periods lighter and decrease cramping.   The hormone IUD can be used for 3 or 5 years.   The copper IUD can be used for 10 years. DISADVANTAGES OF AN INTRAUTERINE DEVICE  The hormone IUD can be associated with irregular bleeding patterns.   The copper IUD can make your menstrual flow heavier and more painful.   You may experience cramping and  vaginal bleeding after insertion.    This information is not intended to replace advice given to you by your health care provider. Make sure you discuss any questions you have with your health care provider.   Document Released: 10/06/2004 Document Revised: 07/05/2013 Document Reviewed: 04/23/2013 Elsevier Interactive Patient Education 2016 Elsevier Inc.  

## 2017-04-16 IMAGING — US US OB TRANSVAGINAL
1 series · 15 of 28 positions shown · non-contrast
Comparison: None

CLINICAL DATA: BILATERAL abdominal pain and light bleeding for 9
days, increasing. History of diabetes mellitus, hypertension,
abdominal pain and rectal pregnancy ; EGA 7 weeks 2 days by LMP of
03/08/2016. No quantitative hCG yet available for correlation

EXAM:
OBSTETRIC <14 WK US AND TRANSVAGINAL OB US
TECHNIQUE: Both transabdominal and transvaginal ultrasound examinations were
performed for complete evaluation of the gestation as well as the
maternal uterus, adnexal regions, and pelvic cul-de-sac.
Transvaginal technique was performed to assess early pregnancy.

[Series 1: us ob transvaginal · 15 of 108 slices shown]
[im 1/108]
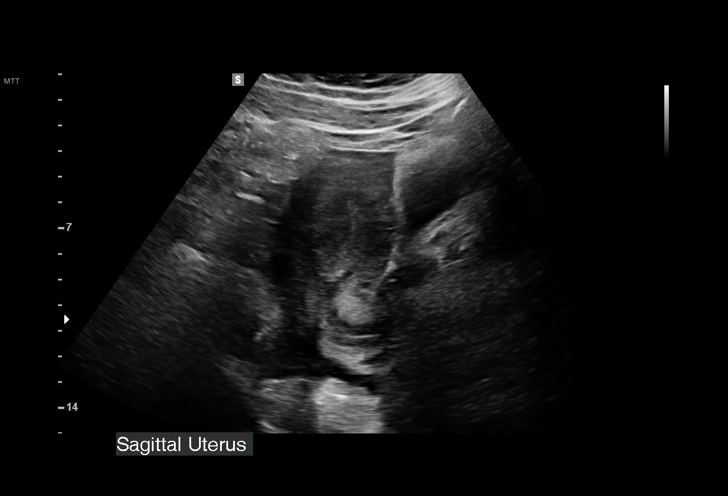
[im 8/108]
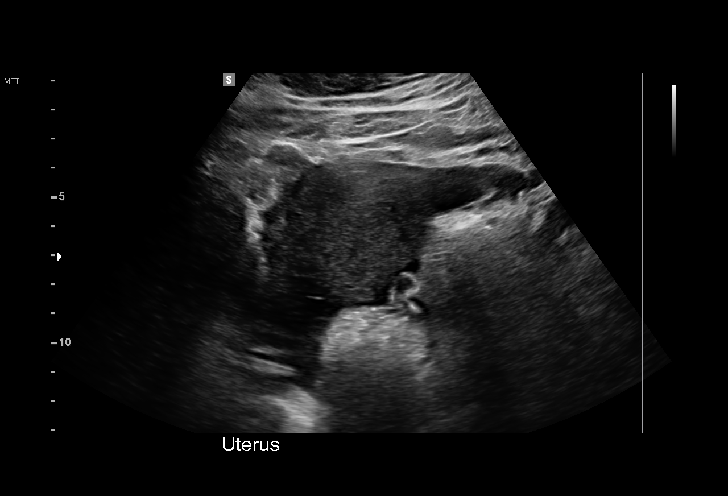
[im 16/108]
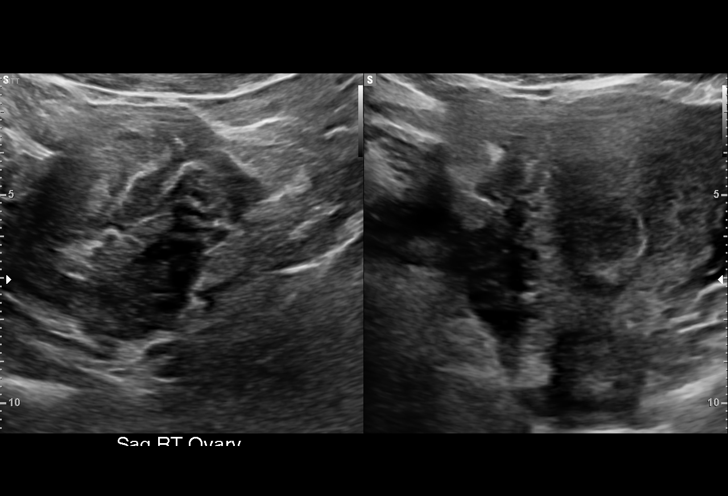
[im 24/108]
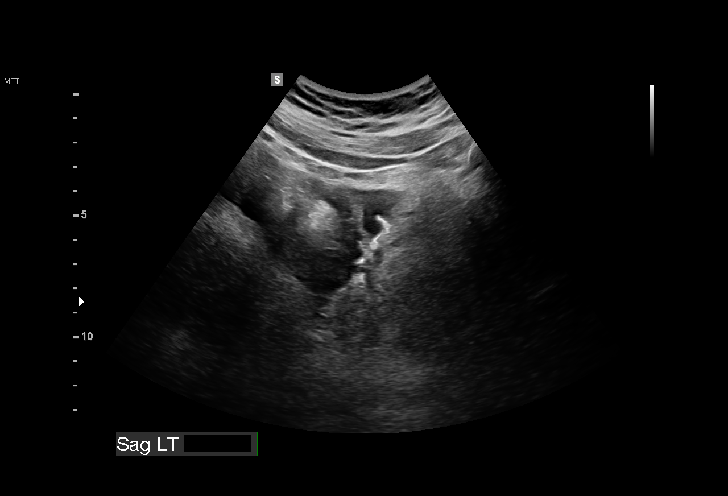
[im 32/108]
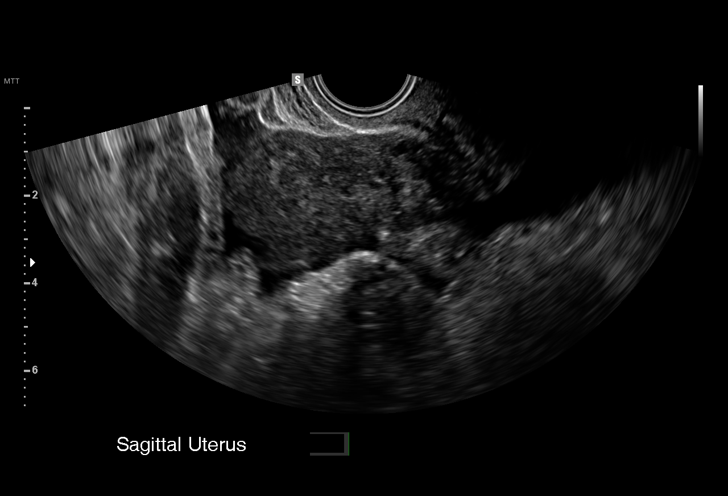
[im 40/108]
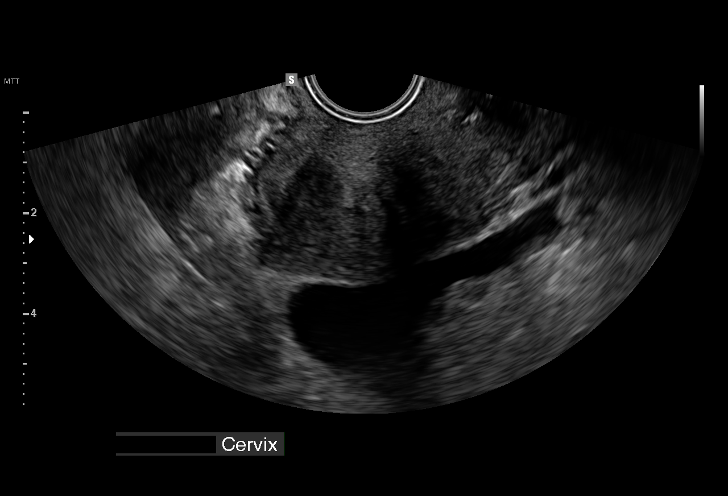
[im 48/108]
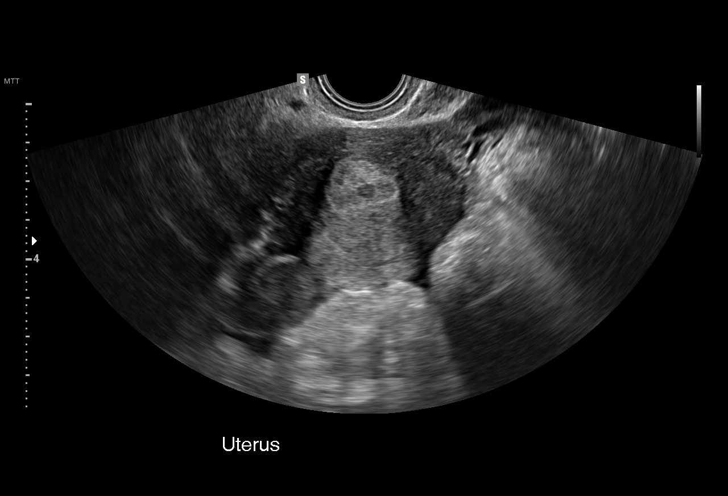
[im 56/108]
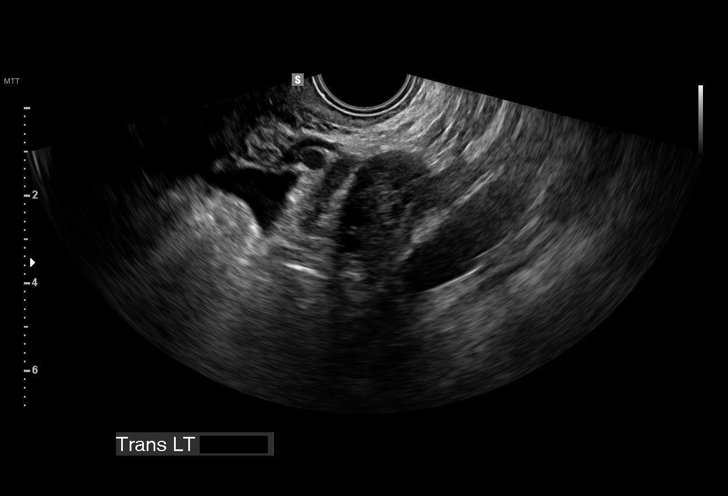
[im 60/108]
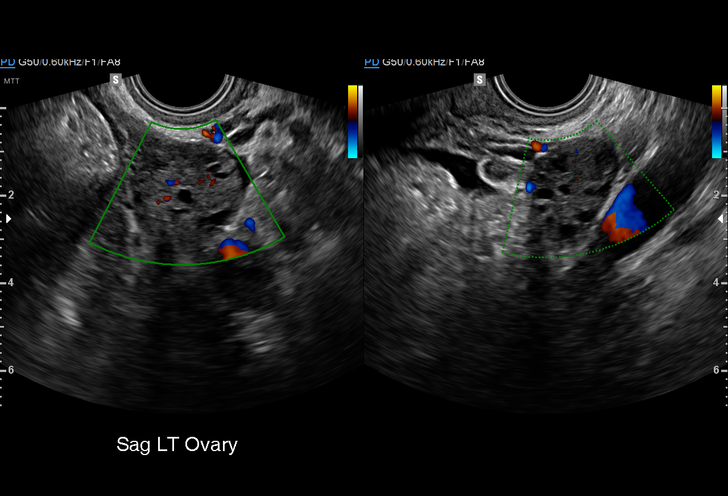
[im 68/108]
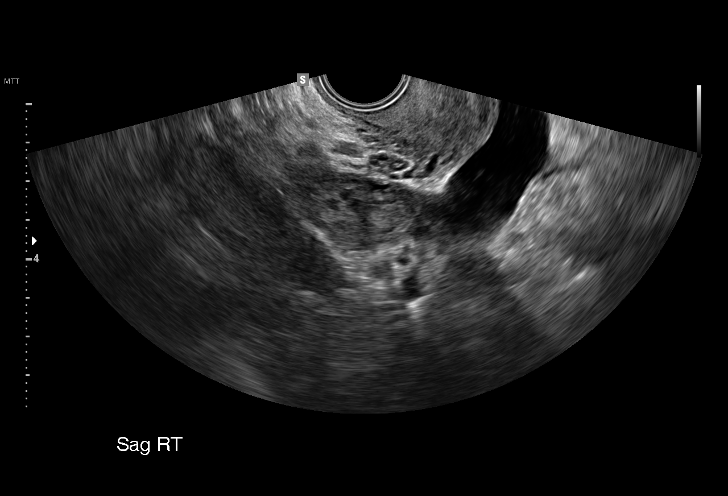
[im 76/108]
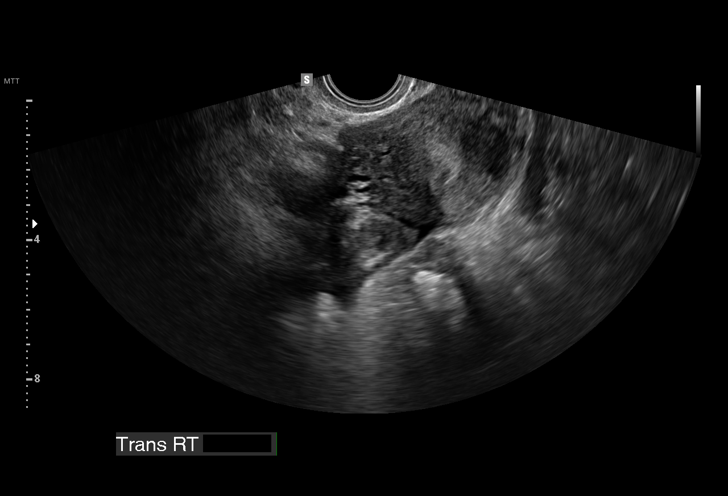
[im 84/108]
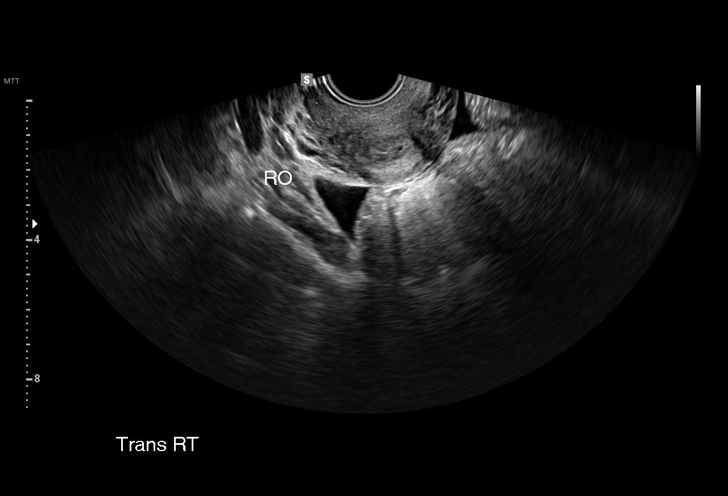
[im 92/108]
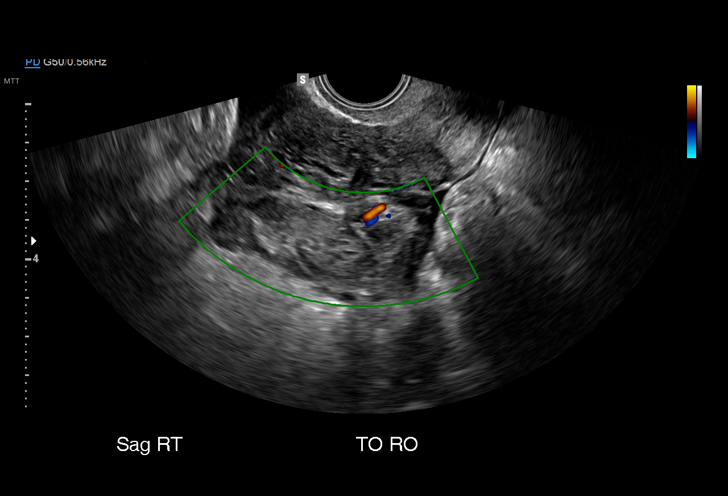
[im 100/108]
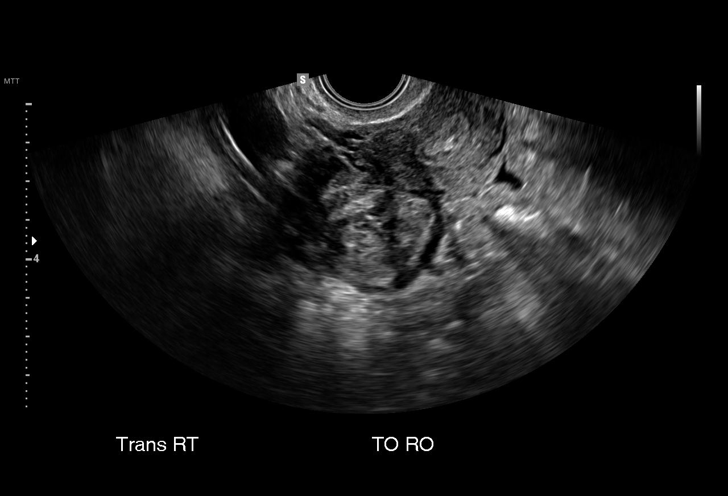
[im 108/108]
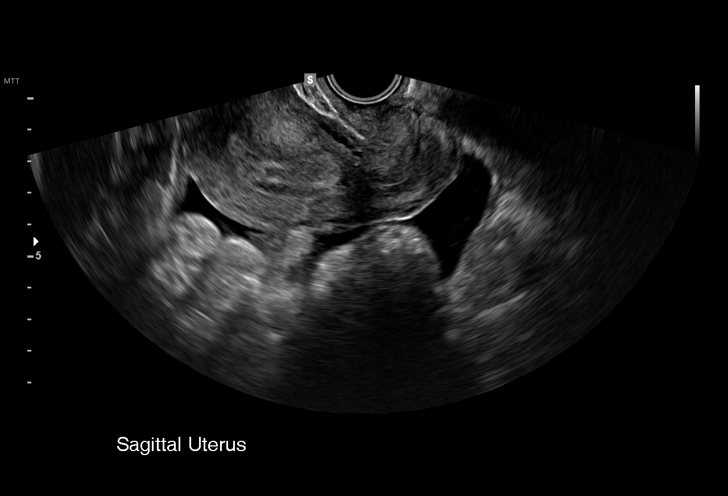

[15 of 28 positions shown; findings below may reference images not displayed]

FINDINGS: Intrauterine gestational sac: Not identified

Yolk sac:  N/A

Embryo:  N/A

Cardiac Activity: N/A

Heart Rate: N/A  bpm

Subchorionic hemorrhage:  N/A

Maternal uterus/adnexae: Uterus normal appearance without mass or
fluid collection.

Heterogeneity of endometrial complex without identification by
gestational sac or discrete fluid.

Moderate free pelvic fluid, minimally complicated in appearance.

LEFT ovary normal size and morphology, 2.9 x 2.7 x 2.0 cm.

RIGHT ovary normal size and morphology 3.9 x 2.0 x 2.6 cm

Superior to and separate from RIGHT ovary is a heterogeneous mass
4.8 x 2.8 x 3.2 cm in size cannot exclude ectopic pregnancy.

No additional pelvic masses.
IMPRESSION: No intrauterine gestation identified.

4.8 x 2.8 x 3.2 cm diameter heterogeneous mass in RIGHT adnexa
separate from the RIGHT ovary, cannot exclude ectopic pregnancy.

Findings called to Che Wah Billie NP in [facility] on 04/28/2016 at 7781
hours.

## 2022-11-16 NOTE — L&D Delivery Note (Signed)
OB/GYN [facility] Operative Vaginal Delivery Note  Sherri Barton is a 27 y.o. G2P0010 s/p VAVD at [redacted]w[redacted]d. She was admitted for PDIOL.   ROM: 19h 58m with clear to moderate meconium stained fluid GBS Status: \ Negative/-- (09/04 0000) Maximum Maternal Temperature: 99.1  Labor Progress: Initial SVE: 1/70-3. She then progressed to complete.   Delivery Date/Time: 08/27/2023 @0748   Delivery: Called to room and patient was complete and pushing for a total of 3 hours. Asked to discontinue pushing due to recurrent late decels  Indication for operative vaginal delivery: Fetal indication persistent Cat II strip  Patient was examined and found to be fully dilated with fetal station of +2.  Patient's foley catheter was noted to be in place, and there were no known fetal contraindications to operative vaginal delivery. Foley was subsequently removed to aid in more room in the pelvis. FHR tracing remarkable for mild tachycardia and persistent late/prolonged decels.   Risks of vacuum assistance were discussed in detail, including but not limited to, bleeding, infection, damage to maternal tissues, fetal cephalohematoma, inability to effect vaginal delivery of the head or shoulder dystocia that cannot be resolved by established maneuvers and need for emergency cesarean section.  Patient gave verbal consent.  The soft vacuum cup was positioned over the sagittal suture 3 cm anterior to posterior fontanelle.  Pressure was then increased to 500 mmHg, and the patient was instructed to push.  Pulling was administered along the pelvic curve while patient was pushing; there were 6 contractions and 5 popoffs. The initial kiwi vacuum was defect and switched out with another. The bell vacuum was subsequently used after 4 kiwi popoffs. The bell vacuum was discontinued after 1 pop off. Vacuum was reduced in between contractions.   Fetal station noted to be at +4 and the patient was evaluated for an episiotomy with  the next contraction. Procedure discussed with the patient and verbal consent given. A right mediolateral episiotomy was made with mayo scissors.   Head delivered ROA. No nuchal cord present. Left compound hand noted. Shoulder and body delivered in usual fashion. Infant with spontaneous cry, placed on mother's abdomen, dried and stimulated. Cord clamped x 2 after 1-minute delay, and cut by FOB. Cord gases and blood drawn. Placenta delivered spontaneously with gentle cord traction. Fundus firm with massage and Pitocin. Labia, perineum, vagina, and cervix inspected. There was a 2nd degree perineal laceration and a brisk arterial bleed in the vaginal vault. A figure of eight stitch was placed with a 3.0 vicryl and the bleeding was found to be hemostatic. Sherri Barton, CNM assumed care at this point and finished perineal repair. Please refer to her documentation for final EBL and repair.    Baby Weight: pending Placenta: 3 vessel, intact. Sent to pathlogy Complications: Fetal decel, VAVD, Episiotomy, Moderate meconium Lacerations: 2nd degree perineal EBL: ~900 mL, mostly from laceration. TXA and pit given.  Analgesia: Epidural   Infant:  APGAR (1 MIN): 8 APGAR (5 MINS): 9  Sherri Dhingra Autry-Lott, DO 08/27/2023, 8:12 AM

## 2022-11-16 NOTE — L&D Delivery Note (Signed)
Delivery Note-Laceration repair  2nd degree perineal laceration identified.  Anesthesia: Epidural Repair: 2-0 Vicryl CT and 4-0 Vicryl SH  QBL (mL): 900+398 for a total of 1298  Complications: PPH APGAR: APGAR (1 MIN): 8  APGAR (5 MINS): 9  APGAR (10 MINS):   Mom to postpartum.  Baby to Couplet care / Skin to Skin.  CBC    Component Value Date/Time   WBC 21.8 (H) 08/27/2023 0828   RBC 3.43 (L) 08/27/2023 0828   HGB 10.7 (L) 08/27/2023 0828   HCT 32.5 (L) 08/27/2023 0828   PLT 186 08/27/2023 0828   MCV 94.8 08/27/2023 0828   MCH 31.2 08/27/2023 0828   MCHC 32.9 08/27/2023 0828   RDW 13.6 08/27/2023 0828    Roma Schanz DNP, CNM 08/27/2023, 6:33 PM

## 2023-02-16 LAB — OB RESULTS CONSOLE ANTIBODY SCREEN: Antibody Screen: NEGATIVE

## 2023-02-16 LAB — OB RESULTS CONSOLE RUBELLA ANTIBODY, IGM: Rubella: IMMUNE

## 2023-02-16 LAB — OB RESULTS CONSOLE RPR: RPR: NONREACTIVE

## 2023-02-16 LAB — OB RESULTS CONSOLE HIV ANTIBODY (ROUTINE TESTING): HIV: NONREACTIVE

## 2023-02-16 LAB — OB RESULTS CONSOLE HEPATITIS B SURFACE ANTIGEN: Hepatitis B Surface Ag: NEGATIVE

## 2023-03-08 LAB — OB RESULTS CONSOLE GC/CHLAMYDIA: Chlamydia: NEGATIVE

## 2023-03-19 ENCOUNTER — Telehealth: Payer: Self-pay

## 2023-03-19 ENCOUNTER — Other Ambulatory Visit: Payer: Self-pay | Admitting: Obstetrics and Gynecology

## 2023-03-19 DIAGNOSIS — Z363 Encounter for antenatal screening for malformations: Secondary | ICD-10-CM

## 2023-03-19 NOTE — Telephone Encounter (Signed)
Left message for patient to call office to schedule Detail, Anatomy ultrasound.

## 2023-04-20 ENCOUNTER — Encounter: Payer: Self-pay | Admitting: *Deleted

## 2023-04-21 DIAGNOSIS — O99210 Obesity complicating pregnancy, unspecified trimester: Secondary | ICD-10-CM | POA: Insufficient documentation

## 2023-04-21 DIAGNOSIS — O4400 Placenta previa specified as without hemorrhage, unspecified trimester: Secondary | ICD-10-CM | POA: Insufficient documentation

## 2023-04-23 ENCOUNTER — Ambulatory Visit: Payer: 59 | Admitting: *Deleted

## 2023-04-23 ENCOUNTER — Other Ambulatory Visit: Payer: Self-pay | Admitting: *Deleted

## 2023-04-23 ENCOUNTER — Ambulatory Visit: Payer: 59 | Attending: Obstetrics and Gynecology

## 2023-04-23 VITALS — BP 112/60 | HR 73

## 2023-04-23 DIAGNOSIS — Z3A23 23 weeks gestation of pregnancy: Secondary | ICD-10-CM

## 2023-04-23 DIAGNOSIS — O99212 Obesity complicating pregnancy, second trimester: Secondary | ICD-10-CM

## 2023-04-23 DIAGNOSIS — O4400 Placenta previa specified as without hemorrhage, unspecified trimester: Secondary | ICD-10-CM | POA: Insufficient documentation

## 2023-04-23 DIAGNOSIS — Z363 Encounter for antenatal screening for malformations: Secondary | ICD-10-CM | POA: Insufficient documentation

## 2023-04-23 DIAGNOSIS — Z362 Encounter for other antenatal screening follow-up: Secondary | ICD-10-CM

## 2023-04-23 DIAGNOSIS — Z148 Genetic carrier of other disease: Secondary | ICD-10-CM | POA: Diagnosis not present

## 2023-05-28 ENCOUNTER — Ambulatory Visit: Payer: 59 | Attending: Obstetrics and Gynecology

## 2023-05-28 ENCOUNTER — Ambulatory Visit: Payer: 59 | Admitting: *Deleted

## 2023-05-28 VITALS — BP 110/54 | HR 89

## 2023-05-28 DIAGNOSIS — Z362 Encounter for other antenatal screening follow-up: Secondary | ICD-10-CM | POA: Diagnosis present

## 2023-05-28 DIAGNOSIS — O99213 Obesity complicating pregnancy, third trimester: Secondary | ICD-10-CM | POA: Diagnosis not present

## 2023-05-28 DIAGNOSIS — E669 Obesity, unspecified: Secondary | ICD-10-CM | POA: Diagnosis not present

## 2023-05-28 DIAGNOSIS — O99212 Obesity complicating pregnancy, second trimester: Secondary | ICD-10-CM | POA: Insufficient documentation

## 2023-05-28 DIAGNOSIS — Z148 Genetic carrier of other disease: Secondary | ICD-10-CM | POA: Diagnosis not present

## 2023-05-28 DIAGNOSIS — Z3A28 28 weeks gestation of pregnancy: Secondary | ICD-10-CM

## 2023-05-28 DIAGNOSIS — O285 Abnormal chromosomal and genetic finding on antenatal screening of mother: Secondary | ICD-10-CM | POA: Diagnosis not present

## 2023-07-21 LAB — OB RESULTS CONSOLE GBS: GBS: NEGATIVE

## 2023-08-23 ENCOUNTER — Telehealth (HOSPITAL_COMMUNITY): Payer: Self-pay | Admitting: *Deleted

## 2023-08-23 ENCOUNTER — Encounter (HOSPITAL_COMMUNITY): Payer: Self-pay | Admitting: *Deleted

## 2023-08-23 ENCOUNTER — Other Ambulatory Visit: Payer: Self-pay | Admitting: Obstetrics & Gynecology

## 2023-08-23 NOTE — Telephone Encounter (Signed)
Preadmission screen  

## 2023-08-25 ENCOUNTER — Other Ambulatory Visit: Payer: Self-pay

## 2023-08-25 ENCOUNTER — Inpatient Hospital Stay (HOSPITAL_COMMUNITY): Payer: 59

## 2023-08-25 ENCOUNTER — Inpatient Hospital Stay (HOSPITAL_COMMUNITY)
Admission: RE | Admit: 2023-08-25 | Discharge: 2023-08-29 | DRG: 806 | Disposition: A | Discharge status: Home / Self Care | Payer: 59 | Attending: Obstetrics and Gynecology | Admitting: Obstetrics and Gynecology

## 2023-08-25 ENCOUNTER — Encounter (HOSPITAL_COMMUNITY): Payer: Self-pay | Admitting: Obstetrics & Gynecology

## 2023-08-25 DIAGNOSIS — Z3A41 41 weeks gestation of pregnancy: Secondary | ICD-10-CM

## 2023-08-25 DIAGNOSIS — Z79899 Other long term (current) drug therapy: Secondary | ICD-10-CM

## 2023-08-25 DIAGNOSIS — F419 Anxiety disorder, unspecified: Secondary | ICD-10-CM | POA: Diagnosis present

## 2023-08-25 DIAGNOSIS — O9962 Diseases of the digestive system complicating childbirth: Secondary | ICD-10-CM | POA: Diagnosis present

## 2023-08-25 DIAGNOSIS — O99214 Obesity complicating childbirth: Secondary | ICD-10-CM | POA: Diagnosis present

## 2023-08-25 DIAGNOSIS — Z148 Genetic carrier of other disease: Secondary | ICD-10-CM

## 2023-08-25 DIAGNOSIS — Z888 Allergy status to other drugs, medicaments and biological substances status: Secondary | ICD-10-CM | POA: Diagnosis not present

## 2023-08-25 DIAGNOSIS — K219 Gastro-esophageal reflux disease without esophagitis: Secondary | ICD-10-CM | POA: Diagnosis present

## 2023-08-25 DIAGNOSIS — D62 Acute posthemorrhagic anemia: Secondary | ICD-10-CM | POA: Diagnosis not present

## 2023-08-25 DIAGNOSIS — F32A Depression, unspecified: Secondary | ICD-10-CM | POA: Diagnosis present

## 2023-08-25 DIAGNOSIS — O9081 Anemia of the puerperium: Secondary | ICD-10-CM | POA: Diagnosis not present

## 2023-08-25 DIAGNOSIS — Z881 Allergy status to other antibiotic agents status: Secondary | ICD-10-CM

## 2023-08-25 DIAGNOSIS — Z8659 Personal history of other mental and behavioral disorders: Secondary | ICD-10-CM

## 2023-08-25 DIAGNOSIS — O99344 Other mental disorders complicating childbirth: Secondary | ICD-10-CM | POA: Diagnosis present

## 2023-08-25 DIAGNOSIS — O480 Post-term pregnancy: Principal | ICD-10-CM | POA: Diagnosis present

## 2023-08-25 LAB — CBC
HCT: 40.9 % (ref 36.0–46.0)
Hemoglobin: 13.6 g/dL (ref 12.0–15.0)
MCH: 30.7 pg (ref 26.0–34.0)
MCHC: 33.3 g/dL (ref 30.0–36.0)
MCV: 92.3 fL (ref 80.0–100.0)
Platelets: 241 10*3/uL (ref 150–400)
RBC: 4.43 MIL/uL (ref 3.87–5.11)
RDW: 13.3 % (ref 11.5–15.5)
WBC: 11 10*3/uL — ABNORMAL HIGH (ref 4.0–10.5)
nRBC: 0 % (ref 0.0–0.2)

## 2023-08-25 LAB — TYPE AND SCREEN
ABO/RH(D): A POS
Antibody Screen: NEGATIVE

## 2023-08-25 MED ORDER — ONDANSETRON HCL 4 MG/2ML IJ SOLN
4.0000 mg | Freq: Four times a day (QID) | INTRAMUSCULAR | Status: DC | PRN
  Administered 2023-08-26: 4 mg via INTRAVENOUS
  Filled 2023-08-25: qty 2

## 2023-08-25 MED ORDER — LACTATED RINGERS IV SOLN: INTRAVENOUS | Status: AC

## 2023-08-25 MED ORDER — OXYTOCIN-SODIUM CHLORIDE 30-0.9 UT/500ML-% IV SOLN: 1.0000 m[IU]/min | INTRAVENOUS | Status: DC

## 2023-08-25 MED ORDER — MISOPROSTOL 25 MCG QUARTER TABLET: 25.0000 ug | ORAL_TABLET | ORAL | Status: DC | PRN

## 2023-08-25 MED ORDER — TERBUTALINE SULFATE 1 MG/ML IJ SOLN: 0.2500 mg | Freq: Once | INTRAMUSCULAR | Status: DC | PRN

## 2023-08-25 MED ORDER — MISOPROSTOL 25 MCG QUARTER TABLET
25.0000 ug | ORAL_TABLET | Freq: Once | ORAL | Status: AC
  Administered 2023-08-25: 25 ug via VAGINAL

## 2023-08-25 MED ORDER — OXYTOCIN-SODIUM CHLORIDE 30-0.9 UT/500ML-% IV SOLN: 2.5000 [IU]/h | INTRAVENOUS | Status: DC

## 2023-08-25 MED ORDER — OXYTOCIN BOLUS FROM INFUSION
333.0000 mL | Freq: Once | INTRAVENOUS | Status: AC
  Administered 2023-08-27: 333 mL via INTRAVENOUS

## 2023-08-25 MED ORDER — MISOPROSTOL 50MCG HALF TABLET: 50.0000 ug | ORAL_TABLET | Freq: Once | ORAL | Status: DC

## 2023-08-25 MED ORDER — MISOPROSTOL 50MCG HALF TABLET
50.0000 ug | ORAL_TABLET | Freq: Once | ORAL | Status: AC
  Administered 2023-08-25: 50 ug via BUCCAL

## 2023-08-25 MED ORDER — LACTATED RINGERS IV SOLN: 500.0000 mL | INTRAVENOUS | Status: AC | PRN

## 2023-08-25 MED ORDER — LIDOCAINE HCL (PF) 1 % IJ SOLN: 30.0000 mL | INTRAMUSCULAR | Status: DC | PRN

## 2023-08-25 MED ORDER — MISOPROSTOL 25 MCG QUARTER TABLET: 25.0000 ug | ORAL_TABLET | Freq: Once | ORAL | Status: DC

## 2023-08-25 MED ORDER — FLEET ENEMA RE ENEM: 1.0000 | ENEMA | RECTAL | Status: DC | PRN

## 2023-08-25 MED ORDER — ACETAMINOPHEN 325 MG PO TABS
650.0000 mg | ORAL_TABLET | ORAL | Status: DC | PRN
  Administered 2023-08-26 – 2023-08-27 (×2): 650 mg via ORAL
  Filled 2023-08-25 (×2): qty 2

## 2023-08-25 MED ORDER — SOD CITRATE-CITRIC ACID 500-334 MG/5ML PO SOLN
30.0000 mL | ORAL | Status: DC | PRN
  Administered 2023-08-26: 30 mL via ORAL
  Filled 2023-08-25: qty 30

## 2023-08-25 MED ORDER — MISOPROSTOL 25 MCG QUARTER TABLET
25.0000 ug | ORAL_TABLET | ORAL | Status: DC | PRN
  Administered 2023-08-26: 25 ug via VAGINAL
  Filled 2023-08-25 (×2): qty 1

## 2023-08-25 NOTE — H&P (Addendum)
Sherri Barton is a 27 y.o. female presenting for induction of labor for later term pregnancy. G2P0010 at [redacted] weeks EGA, LMP 11/11/2022 [facility] 08/18/2023.   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   Patient with a history of bipolar disorder, anxiety and depression and has been getting therapy as well as using lamotrigine in pregnancy.   OB History     Gravida  2   Para      Term      Preterm      AB  1   Living         SAB  1   IAB      Ectopic      Multiple      Live Births             Past Medical History:  Diagnosis Date   Hemoperitoneum due to rupture of right tubal ectopic pregnancy 04/28/2016   Medical history non-contributory    Past Surgical History:  Procedure Laterality Date   DIAGNOSTIC LAPAROSCOPY WITH REMOVAL OF ECTOPIC PREGNANCY Right 04/28/2016   Procedure: DIAGNOSTIC LAPAROSCOPY WITH REMOVAL OF RIGHT ECTOPIC PREGNANCY;  Surgeon: Adam Phenix, MD;  Location: [facility];  Service: Gynecology;  Laterality: Right;   LAPAROSCOPIC UNILATERAL SALPINGECTOMY  04/28/2016   Procedure: LAPAROSCOPIC RIGHT SALPINGECTOMY;  Surgeon: Adam Phenix, MD;  Location: [facility];  Service: Gynecology;;   NO PAST SURGERIES     Family History: family history is not on file. Social History:  reports that she has never smoked. She has never used smokeless tobacco. She reports that she does not drink alcohol and does not use drugs.   Maternal Diabetes: No Genetic Screening: Normal Maternal Ultrasounds/Referrals: Normal  07/21/23: EFW 6.1 lbs.  Fetal Ultrasounds or other Referrals:  None Maternal Substance Abuse:  No Significant Maternal Medications:  None Significant Maternal Lab Results:  Group B Strep negative Number of Prenatal Visits:greater than 3 verified prenatal visits Maternal Vaccinations:None recently.  Other Comments:  None  ROS  Constitutional:  Denies fevers/chills Cardiovascular: Denies chest pain or palpitations Pulmonary: Denies coughing or wheezing Gastrointestinal: Denies nausea, vomiting or diarrhea Genitourinary: Denies pelvic pain, unusual vaginal bleeding, unusual vaginal discharge, dysuria, urgency or frequency.  Musculoskeletal: Denies muscle or joint aches and pain.  Neurology: Denies abnormal sensations such as tingling or numbness.     Last menstrual period 11/11/2022, unknown if currently breastfeeding. Physical Exam  Blood pressure 129/69, pulse 73, temperature 98.2 F (36.8 C), temperature source Oral, last menstrual period 11/11/2022, unknown if currently breastfeeding.  Constitutional: She is oriented to person, place, and time. She appears well-developed and well-nourished.  HENT:  Head: Normocephalic and atraumatic.  Neck: Normal range of motion.  Cardiovascular: Normal rate, regular rhythm and normal heart sounds.   Respiratory: Effort normal and breath sounds normal.  GI: Soft. Genitorurinary: Gravid uterus, appropriate for gestational age.    Neurological: She is alert and oriented to person, place, and time.  Skin: Skin is warm and dry.  Psychiatric: She has a normal mood and affect. Her behavior is normal.    NST: 120 baseline, moderate variability, reactive. TOCO: No contractions.   Current Outpatient Medications  Medication Instructions   ferrous sulfate 325 mg, Oral, Daily with breakfast   ibuprofen (ADVIL) 600 mg, Oral, Every 6 hours PRN   lamoTRIgine (LAMICTAL) 150 mg, Oral, Daily   omeprazole (PRILOSEC) 40 mg,  Oral, Daily   oxyCODONE-acetaminophen (PERCOCET/ROXICET) 5-325 MG tablet 1-2 tablets, Oral, Every 6 hours PRN   Prenatal Vit-Fe Fumarate-FA (MULTIVITAMIN-PRENATAL) 27-0.8 MG TABS tablet 1 tablet, Oral, Daily, Reported on 05/25/2016     Allergies  Allergen Reactions   Other Hives and Swelling    triprevafem   Tri-Sprintec [Norgestim-Eth Estrad Triphasic] Hives and Swelling    Augmentin [Amoxicillin-Pot Clavulanate] Rash    Has patient had a PCN reaction causing immediate rash, facial/tongue/throat swelling, SOB or lightheadedness with hypotension: Yes Has patient had a PCN reaction causing severe rash involving mucus membranes or skin necrosis: Yes Has patient had a PCN reaction that required hospitalization No Has patient had a PCN reaction occurring within the last 10 years: Yes If all of the above answers are "NO", then may proceed with Cephalosporin use.      Prenatal labs: ABO, Rh:  A pos.  Antibody: Negative (04/02 0000) Rubella: Immune (04/02 0000) RPR: Nonreactive (04/02 0000)  HBsAg: Negative (04/02 0000)  HIV: Non-reactive (04/02 0000)  GBS: Negative/-- (09/04 0000)   Recent Results (from the past 2160 hour(s))  OB RESULT CONSOLE Group B Strep     Status: None   Collection Time: 07/21/23 12:00 AM  Result Value Ref Range   GBS Negative   CBC     Status: Abnormal   Collection Time: 08/25/23  9:55 PM  Result Value Ref Range   WBC 11.0 (H) 4.0 - 10.5 K/uL   RBC 4.43 3.87 - 5.11 MIL/uL   Hemoglobin 13.6 12.0 - 15.0 g/dL   HCT 29.5 62.1 - 30.8 %   MCV 92.3 80.0 - 100.0 fL   MCH 30.7 26.0 - 34.0 pg   MCHC 33.3 30.0 - 36.0 g/dL   RDW 65.7 84.6 - 96.2 %   Platelets 241 150 - 400 K/uL   nRBC 0.0 0.0 - 0.2 %    Comment: Performed at [facility], [address] [place], [place] [address]  Type and screen     Status: None   Collection Time: 08/25/23  9:55 PM  Result Value Ref Range   ABO/RH(D) A POS    Antibody Screen NEG    Sample Expiration      08/28/2023,2359 Performed at [facility], [address] [place], [place] [address]     Assessment/Plan: 27 y/o G2P0010 at [redacted] weeks EGA here for induction of labor for late term pregnancy, - Admit to [facility] and Delivery as per admit and induction orders.  - Rsks, benefits and alternatives of labor induction including risks of cesarean delivery have already been discussed  with the patient and all questions have been answered  Will start with buccal and vaginal cytotec start.    Prescilla Sours, MD.  08/25/2023, 3:12 PM

## 2023-08-26 ENCOUNTER — Inpatient Hospital Stay (HOSPITAL_COMMUNITY): Payer: 59 | Admitting: Anesthesiology

## 2023-08-26 ENCOUNTER — Encounter (HOSPITAL_COMMUNITY): Payer: Self-pay | Admitting: Obstetrics & Gynecology

## 2023-08-26 LAB — RPR: RPR Ser Ql: NONREACTIVE

## 2023-08-26 MED ORDER — EPHEDRINE 5 MG/ML INJ
10.0000 mg | INTRAVENOUS | Status: DC | PRN
  Filled 2023-08-26: qty 5

## 2023-08-26 MED ORDER — LACTATED RINGERS IV SOLN: INTRAVENOUS | Status: DC

## 2023-08-26 MED ORDER — PHENYLEPHRINE 80 MCG/ML (10ML) SYRINGE FOR IV PUSH (FOR BLOOD PRESSURE SUPPORT): 80.0000 ug | PREFILLED_SYRINGE | INTRAVENOUS | Status: DC | PRN

## 2023-08-26 MED ORDER — FENTANYL CITRATE (PF) 100 MCG/2ML IJ SOLN: 100.0000 ug | Freq: Once | INTRAMUSCULAR | Status: AC

## 2023-08-26 MED ORDER — DIPHENHYDRAMINE HCL 50 MG/ML IJ SOLN: 12.5000 mg | INTRAMUSCULAR | Status: DC | PRN

## 2023-08-26 MED ORDER — TERBUTALINE SULFATE 1 MG/ML IJ SOLN: 0.2500 mg | Freq: Once | INTRAMUSCULAR | Status: DC | PRN

## 2023-08-26 MED ORDER — FENTANYL CITRATE (PF) 100 MCG/2ML IJ SOLN
100.0000 ug | INTRAMUSCULAR | Status: DC | PRN
  Administered 2023-08-26: 100 ug via INTRAVENOUS
  Filled 2023-08-26: qty 2

## 2023-08-26 MED ORDER — FENTANYL CITRATE (PF) 100 MCG/2ML IJ SOLN
100.0000 ug | Freq: Once | INTRAMUSCULAR | Status: AC
  Administered 2023-08-26: 100 ug via INTRAVENOUS
  Filled 2023-08-26: qty 2

## 2023-08-26 MED ORDER — EPHEDRINE 5 MG/ML INJ: 10.0000 mg | INTRAVENOUS | Status: DC | PRN

## 2023-08-26 MED ORDER — OXYTOCIN-SODIUM CHLORIDE 30-0.9 UT/500ML-% IV SOLN
1.0000 m[IU]/min | INTRAVENOUS | Status: DC
  Administered 2023-08-26: 1 m[IU]/min via INTRAVENOUS

## 2023-08-26 MED ORDER — FENTANYL CITRATE (PF) 100 MCG/2ML IJ SOLN
INTRAMUSCULAR | Status: AC
  Filled 2023-08-26: qty 2

## 2023-08-26 MED ORDER — OXYTOCIN-SODIUM CHLORIDE 30-0.9 UT/500ML-% IV SOLN
1.0000 m[IU]/min | INTRAVENOUS | Status: DC
  Administered 2023-08-26: 1 m[IU]/min via INTRAVENOUS
  Filled 2023-08-26: qty 500

## 2023-08-26 MED ORDER — FENTANYL CITRATE (PF) 100 MCG/2ML IJ SOLN
100.0000 ug | Freq: Once | INTRAMUSCULAR | Status: AC
  Administered 2023-08-26: 100 ug via INTRAVENOUS

## 2023-08-26 MED ORDER — PHENYLEPHRINE 80 MCG/ML (10ML) SYRINGE FOR IV PUSH (FOR BLOOD PRESSURE SUPPORT)
80.0000 ug | PREFILLED_SYRINGE | INTRAVENOUS | Status: DC | PRN
  Administered 2023-08-27: 80 ug via INTRAVENOUS
  Filled 2023-08-26: qty 10

## 2023-08-26 MED ORDER — FENTANYL CITRATE (PF) 100 MCG/2ML IJ SOLN
INTRAMUSCULAR | Status: AC
  Administered 2023-08-26: 100 ug via INTRAVENOUS
  Filled 2023-08-26: qty 2

## 2023-08-26 MED ORDER — LACTATED RINGERS IV SOLN
500.0000 mL | Freq: Once | INTRAVENOUS | Status: AC
  Administered 2023-08-27: 500 mL via INTRAVENOUS

## 2023-08-26 MED ORDER — FENTANYL-BUPIVACAINE-NACL 0.5-0.125-0.9 MG/250ML-% EP SOLN
12.0000 mL/h | EPIDURAL | Status: DC | PRN
  Administered 2023-08-26 (×2): 12 mL/h via EPIDURAL
  Filled 2023-08-26 (×2): qty 250

## 2023-08-26 MED ORDER — LIDOCAINE-EPINEPHRINE (PF) 1.5 %-1:200000 IJ SOLN
INTRAMUSCULAR | Status: DC | PRN
Start: 2023-08-26 — End: 2023-08-27
  Administered 2023-08-26: 5 mL via EPIDURAL

## 2023-08-26 NOTE — Progress Notes (Signed)
Labor Progress Note Sherri Barton is a 27 y.o. G2P0010 at [redacted]w[redacted]d presented for pdIOL  S: No acute concerns.   O:  BP 128/67   Pulse 96   Temp 99.1 F (37.3 C) (Axillary)   Resp 16   Ht 5\' 4"  (1.626 m)   Wt 101.2 kg   LMP 11/11/2022   SpO2 96%   BMI 38.28 kg/m  EFM: 125bpm/moderate/+accels, variable decel during cervical exam  CVE: Dilation: 7.5 Effacement (%): 90 Cervical Position: Posterior Station: -1 Presentation: Vertex Exam by:: Andrey Campanile, RN   A&P: 27 y.o. G2P0010 [redacted]w[redacted]d here for IOL 2/2 pdIOL #Labor: Unchanged since last exam but having regular contractions with MVUs of 150-180s. Prior fetal decels with 1mU of pitocin. Consider restarting pitocin if unchanged from last exam.  #Pain: Epidural #FWB: Cat I, with periods of Cat II but overall reassuring  #GBS negative   [name], DO 11:16 PM

## 2023-08-26 NOTE — Progress Notes (Signed)
Sherri Barton is a 27 y.o. G2P0010 at [redacted]w[redacted]d  Subjective: Comfortable with epidural  Objective: BP (!) 128/58   Pulse 64   Temp 98.2 F (36.8 C) (Oral)   Resp 16   LMP 11/11/2022   SpO2 96%  No intake/output data recorded. No intake/output data recorded.  FHT:  FHR: 130 bpm, variability: moderate,  accelerations:  Present,  decelerations:  Absent UC:   irregular, every 1-4 minutes SVE:   Dilation: 5 Effacement (%): 80 Station: -2 Exam by:: Osborn Coho, MD  AROM clear with slight blood tinge/bloody show  Labs: Lab Results  Component Value Date   WBC 11.0 (H) 08/25/2023   HGB 13.6 08/25/2023   HCT 40.9 08/25/2023   MCV 92.3 08/25/2023   PLT 241 08/25/2023    Assessment / Plan: IOL  d/t post-dates/late term pregnancy.  Labor:  progressing in her own labor s/p cytotec x 2.  Augmentation with 1mu of pitocin lead to decels so that was stopped.  IUPC placed after AROM now. Preeclampsia:  no signs or symptoms of toxicity Fetal Wellbeing:  Category I Pain Control:  Epidural I/D:   GBS neg Anticipated MOD:  NSVD  Purcell Nails, MD 08/26/2023, 12:46 PM

## 2023-08-26 NOTE — Anesthesia Preprocedure Evaluation (Addendum)
Anesthesia Evaluation  Patient identified by MRN, date of birth, ID band Patient awake    Reviewed: Allergy & Precautions, NPO status , Patient's Chart, lab work & pertinent test results  Airway Mallampati: II  TM Distance: >3 FB Neck ROM: Full    Dental no notable dental hx.    Pulmonary neg pulmonary ROS   Pulmonary exam normal        Cardiovascular + CAD   Rhythm:Regular Rate:Normal     Neuro/Psych negative neurological ROS  negative psych ROS   GI/Hepatic Neg liver ROS,GERD  Medicated,,  Endo/Other  negative endocrine ROS    Renal/GU negative Renal ROS  negative genitourinary   Musculoskeletal negative musculoskeletal ROS (+)    Abdominal Normal abdominal exam  (+)   Peds  Hematology Lab Results      Component                Value               Date                      WBC                      11.0 (H)            08/25/2023                HGB                      13.6                08/25/2023                HCT                      40.9                08/25/2023                MCV                      92.3                08/25/2023                PLT                      241                 08/25/2023              Anesthesia Other Findings   Reproductive/Obstetrics                             Anesthesia Physical Anesthesia Plan  ASA: 2  Anesthesia Plan: Epidural   Post-op Pain Management:    Induction:   PONV Risk Score and Plan: 2 and Treatment may vary due to age or medical condition  Airway Management Planned: Natural Airway  Additional Equipment: None  Intra-op Plan:   Post-operative Plan:   Informed Consent: I have reviewed the patients History and Physical, chart, labs and discussed the procedure including the risks, benefits and alternatives for the proposed anesthesia with the patient or authorized representative who has indicated his/her understanding and  acceptance.     Dental advisory given  Plan Discussed with:   Anesthesia Plan Comments:        Anesthesia Quick Evaluation

## 2023-08-26 NOTE — Anesthesia Procedure Notes (Signed)
Epidural Patient location during procedure: OB Start time: 08/26/2023 9:20 AM End time: 08/26/2023 9:26 AM  Staffing Anesthesiologist: Atilano Median, DO Performed: anesthesiologist   Preanesthetic Checklist Completed: patient identified, IV checked, site marked, risks and benefits discussed, surgical consent, monitors and equipment checked, pre-op evaluation and timeout performed  Epidural Patient position: sitting Prep: ChloraPrep Patient monitoring: heart rate, continuous pulse ox and blood pressure Approach: midline Location: L4-L5 Injection technique: LOR saline  Needle:  Needle type: Tuohy  Needle gauge: 17 G Needle length: 9 cm Needle insertion depth: 7 cm Catheter type: closed end flexible Catheter size: 20 Guage Catheter at skin depth: 12 cm Test dose: negative and 1.5% lidocaine with Epi 1:200 K  Assessment Events: blood not aspirated, no cerebrospinal fluid, injection not painful, no injection resistance and no paresthesia  Additional Notes  Patient identified. Risks/Benefits/Options discussed with patient including but not limited to bleeding, infection, nerve damage, paralysis, failed block, incomplete pain control, headache, blood pressure changes, nausea, vomiting, reactions to medications, itching and postpartum back pain. Confirmed with bedside nurse the patient's most recent platelet count. Confirmed with patient that they are not currently taking any anticoagulation, have any bleeding history or any family history of bleeding disorders. Patient expressed understanding and wished to proceed. All questions were answered. Sterile technique was used throughout the entire procedure. Please see nursing notes for vital signs. Test dose was given through epidural catheter and negative prior to continuing to dose epidural or start infusion. Warning signs of high block given to the patient including shortness of breath, tingling/numbness in hands, complete motor  block, or any concerning symptoms with instructions to call for help. Patient was given instructions on fall risk and not to get out of bed. All questions and concerns addressed with instructions to call with any issues or inadequate analgesia.    Reason for block:procedure for pain

## 2023-08-27 ENCOUNTER — Encounter (HOSPITAL_COMMUNITY): Payer: Self-pay | Admitting: Obstetrics & Gynecology

## 2023-08-27 LAB — CBC
HCT: 32.5 % — ABNORMAL LOW (ref 36.0–46.0)
Hemoglobin: 10.7 g/dL — ABNORMAL LOW (ref 12.0–15.0)
MCH: 31.2 pg (ref 26.0–34.0)
MCHC: 32.9 g/dL (ref 30.0–36.0)
MCV: 94.8 fL (ref 80.0–100.0)
Platelets: 186 10*3/uL (ref 150–400)
RBC: 3.43 MIL/uL — ABNORMAL LOW (ref 3.87–5.11)
RDW: 13.6 % (ref 11.5–15.5)
WBC: 21.8 10*3/uL — ABNORMAL HIGH (ref 4.0–10.5)
nRBC: 0 % (ref 0.0–0.2)

## 2023-08-27 MED ORDER — DIBUCAINE (PERIANAL) 1 % EX OINT: 1.0000 | TOPICAL_OINTMENT | CUTANEOUS | Status: DC | PRN

## 2023-08-27 MED ORDER — COCONUT OIL OIL
1.0000 | TOPICAL_OIL | Status: DC | PRN
  Administered 2023-08-28: 1 via TOPICAL

## 2023-08-27 MED ORDER — SODIUM CHLORIDE 0.9 % IV SOLN
500.0000 mg | Freq: Once | INTRAVENOUS | Status: AC
  Administered 2023-08-27: 500 mg via INTRAVENOUS
  Filled 2023-08-27: qty 25

## 2023-08-27 MED ORDER — TRANEXAMIC ACID-NACL 1000-0.7 MG/100ML-% IV SOLN
INTRAVENOUS | Status: AC
  Filled 2023-08-27: qty 100

## 2023-08-27 MED ORDER — DIPHENHYDRAMINE HCL 25 MG PO CAPS: 25.0000 mg | ORAL_CAPSULE | Freq: Four times a day (QID) | ORAL | Status: DC | PRN

## 2023-08-27 MED ORDER — ACETAMINOPHEN 325 MG PO TABS
650.0000 mg | ORAL_TABLET | ORAL | Status: DC | PRN
  Administered 2023-08-28: 650 mg via ORAL
  Filled 2023-08-27: qty 2

## 2023-08-27 MED ORDER — BENZOCAINE-MENTHOL 20-0.5 % EX AERO
1.0000 | INHALATION_SPRAY | CUTANEOUS | Status: DC | PRN
  Administered 2023-08-27: 1 via TOPICAL
  Filled 2023-08-27: qty 56

## 2023-08-27 MED ORDER — SIMETHICONE 80 MG PO CHEW: 80.0000 mg | CHEWABLE_TABLET | ORAL | Status: DC | PRN

## 2023-08-27 MED ORDER — LACTATED RINGERS IV SOLN: INTRAVENOUS | Status: DC

## 2023-08-27 MED ORDER — IBUPROFEN 600 MG PO TABS
600.0000 mg | ORAL_TABLET | Freq: Four times a day (QID) | ORAL | Status: DC
  Administered 2023-08-27 – 2023-08-29 (×9): 600 mg via ORAL
  Filled 2023-08-27 (×9): qty 1

## 2023-08-27 MED ORDER — ZOLPIDEM TARTRATE 5 MG PO TABS: 5.0000 mg | ORAL_TABLET | Freq: Every evening | ORAL | Status: DC | PRN

## 2023-08-27 MED ORDER — TRANEXAMIC ACID-NACL 1000-0.7 MG/100ML-% IV SOLN
1000.0000 mg | INTRAVENOUS | Status: AC
  Administered 2023-08-27: 1000 mg via INTRAVENOUS

## 2023-08-27 MED ORDER — ONDANSETRON HCL 4 MG/2ML IJ SOLN: 4.0000 mg | INTRAMUSCULAR | Status: DC | PRN

## 2023-08-27 MED ORDER — TRANEXAMIC ACID-NACL 1000-0.7 MG/100ML-% IV SOLN: 1000.0000 mg | INTRAVENOUS | Status: DC

## 2023-08-27 MED ORDER — SENNOSIDES-DOCUSATE SODIUM 8.6-50 MG PO TABS
2.0000 | ORAL_TABLET | Freq: Every day | ORAL | Status: DC
  Administered 2023-08-28 – 2023-08-29 (×2): 2 via ORAL
  Filled 2023-08-27 (×2): qty 2

## 2023-08-27 MED ORDER — ONDANSETRON HCL 4 MG PO TABS: 4.0000 mg | ORAL_TABLET | ORAL | Status: DC | PRN

## 2023-08-27 MED ORDER — DIPHENHYDRAMINE HCL 50 MG/ML IJ SOLN
25.0000 mg | INTRAMUSCULAR | Status: AC
  Administered 2023-08-27: 25 mg via INTRAVENOUS
  Filled 2023-08-27: qty 1

## 2023-08-27 MED ORDER — PRENATAL MULTIVITAMIN CH
1.0000 | ORAL_TABLET | Freq: Every day | ORAL | Status: DC
  Administered 2023-08-27 – 2023-08-29 (×3): 1 via ORAL
  Filled 2023-08-27 (×3): qty 1

## 2023-08-27 MED ORDER — WITCH HAZEL-GLYCERIN EX PADS: 1.0000 | MEDICATED_PAD | CUTANEOUS | Status: DC | PRN

## 2023-08-27 MED ORDER — TETANUS-DIPHTH-ACELL PERTUSSIS 5-2.5-18.5 LF-MCG/0.5 IM SUSY: 0.5000 mL | PREFILLED_SYRINGE | Freq: Once | INTRAMUSCULAR | Status: DC

## 2023-08-27 MED ORDER — LAMOTRIGINE 150 MG PO TABS
150.0000 mg | ORAL_TABLET | Freq: Every day | ORAL | Status: DC
  Administered 2023-08-27 – 2023-08-29 (×3): 150 mg via ORAL
  Filled 2023-08-27 (×3): qty 1

## 2023-08-27 NOTE — Lactation Note (Signed)
This note was copied from a baby's chart. Lactation Consultation Note  Patient Name: Sherri Barton [id number] Date: 08/27/2023 Age:29 hours Reason for consult: Follow-up assessment;Mother's request;Primapara;Term;1st time breastfeeding  Visited P1 parent per mom's request. LC set up hand pump for pre-pumping before latching (LC assisted with latch - see LATCH score). Infant fussy at the breast and falls asleep doing skin to skin after latching and a few suckles. Mom has easily expressed colostrum and will call out for latch assist with next feeding.  Maternal Data Has patient been taught Hand Expression?: Yes  Feeding Mother's Current Feeding Choice: Breast Milk  LATCH Score Latch: Too sleepy or reluctant, no latch achieved, no sucking elicited.  Audible Swallowing: None  Type of Nipple: Everted at rest and after stimulation  Comfort (Breast/Nipple): Soft / non-tender  Hold (Positioning): No assistance needed to correctly position infant at breast.  LATCH Score: 6   Lactation Tools Discussed/Used Tools: Pump;Flanges Flange Size: 21 Breast pump type: Manual Pump Education: Milk Storage;Setup, frequency, and cleaning Reason for Pumping: establish supply  Interventions Interventions: Breast feeding basics reviewed;Assisted with latch;Skin to skin;Breast compression;Adjust position;Support pillows;Position options;Hand pump  Discharge Pump: DEBP;Personal WIC Program: Yes  Consult Status Consult Status: Follow-up Date: 08/28/23 Follow-up type: In-patient    [name] 08/27/2023, 4:03 PM

## 2023-08-27 NOTE — Lactation Note (Signed)
This note was copied from a baby's chart. Lactation Consultation Note  Patient Name: Sherri Barton ZOXWR'U Date: 08/27/2023 Age:29 hours Reason for consult: Follow-up assessment;Mother's request;Difficult latch MOB attempted to latch infant on her right breast, infant did not sustain latch would latch and then come off breast, very fussy. Infant was given 5 mls of colostrum by spoon, FOB asssited with hand expression. Birth Parent will continue to work towards latching infant at the breast and do lots of skin to skin with infant. MOB will ask for donor milk if infant does not latch at the next feeding , MOB is aware if using donor breast milk she must use the DEBP every 3 hours to help with establishing her milk supply. RN is aware and MOB will ask RN if she chooses to use donor breast milk.  Current plan: 1- Continue to BF infant by cues, every 2-3 hours, skin to skin. 2- Continue to ask RN/LC for latch assistance. 3- If infant continues not to latch  at the next feeding MOB plans to start to supplement infant with donor breast milk this is her feeding choice and aware to use DEBP if she supplements infant.    Maternal Data    Feeding Mother's Current Feeding Choice: Breast Milk  LATCH Score Latch: Repeated attempts needed to sustain latch, nipple held in mouth throughout feeding, stimulation needed to elicit sucking reflex.  Audible Swallowing: None  Type of Nipple: Flat  Comfort (Breast/Nipple): Soft / non-tender  Hold (Positioning): Assistance needed to correctly position infant at breast and maintain latch.  LATCH Score: 5   Lactation Tools Discussed/Used    Interventions Interventions: Skin to skin;Assisted with latch;Support pillows;Adjust position;Breast compression;Position options;Reverse pressure;Education;Expressed milk  Discharge    Consult Status Consult Status: Follow-up Date: 08/28/23 Follow-up type: In-patient    Frederico Hamman 08/27/2023,  8:46 PM

## 2023-08-27 NOTE — Lactation Note (Signed)
This note was copied from a baby's chart. Lactation Consultation Note  Patient Name: Sherri Barton IONGE'X Date: 08/27/2023 Age:29 hours Reason for consult: Initial assessment;1st time breastfeeding;Primapara;Term  Visited with P1 parent of term infant. Upon entry, nurse mentioned parent cannot sit up due to drop in blood pressure (limited position options available at this time). LC assisted with latch (see LATCH score). D/t inability to latch well at this time, LC spoon fed 2ml of colostrum after assisting parent with hand expression.   Feeding Plan 1) Breastfeed infant skin to skin every 2-3 hours, or earlier if infant cues. 2) Call RN/LC for breastfeeding assistance.  Maternal Data Has patient been taught Hand Expression?: Yes  Feeding Mother's Current Feeding Choice: Breast Milk  LATCH Score Latch: Too sleepy or reluctant, no latch achieved, no sucking elicited.  Audible Swallowing: None  Type of Nipple: Everted at rest and after stimulation  Comfort (Breast/Nipple): Soft / non-tender  Hold (Positioning): Assistance needed to correctly position infant at breast and maintain latch.  LATCH Score: 5   Interventions Interventions: Breast feeding basics reviewed;Skin to skin;Assisted with latch;Hand express;Breast compression;Adjust position;Support pillows;Position options;Education;LC Services brochure  Discharge Pump: DEBP;Personal WIC Program: Yes  Consult Status Consult Status: Follow-up Date: 08/28/23 Follow-up type: In-patient    [name] 08/27/2023, 1:40 PM

## 2023-08-27 NOTE — Progress Notes (Signed)
Labor Progress Note Sherri Barton is a 27 y.o. G2P0010 at [redacted]w[redacted]d presented for pdIOL  S: Presented to the room due to multiple fetal decels requiring multiple maternal position changes and discontinuing 1mU of pitocin. The patient was in hands and knees when I presented to the room.   O:  BP (!) 103/42   Pulse 90   Temp 97.6 F (36.4 C) (Axillary)   Resp 16   Ht 5\' 4"  (1.626 m)   Wt 101.2 kg   LMP 11/11/2022   SpO2 96%   BMI 38.28 kg/m  EFM: 150bpm/min to moderate/+accels, 6 minute prolonged decel  CVE: Dilation: 9 Effacement (%): 90 (swelling) Cervical Position: Posterior Station: 0 Presentation: Vertex Exam by:: Casey Burkitt, RN  A&P: 27 y.o. G2P0010 [redacted]w[redacted]d here for IOL 2/2 pdIOL #Labor: Making slow change. Became tachysystole with 1 mU of pitocin resulting in a 6 minute prolonged decel. Pitocin was discontinued at this time and maternal positioning optimized. FSE was previously placed but then become dislodged. We returned to external monitoring. IUPC in place. S/p benadryl due to edematous cervical changes. Contraction pattern is irregular at this time. Delivery imminent and mode contingent on fetal tolerance.  #Pain: Epidural #FWB: Cat II given prolonged decel but good return to baseline with improving variability  #GBS negative   Duaine Radin Autry-Lott, DO 1:56 AM

## 2023-08-27 NOTE — Progress Notes (Signed)
Labor Progress Note Sherri Barton is a 27 y.o. G2P0010 at [redacted]w[redacted]d presented for pdIOL  S: Pushing. Previous back discomfort has resolved.   O:  BP 106/81   Pulse 97   Temp 97.6 F (36.4 C) (Axillary)   Resp 16   Ht 5\' 4"  (1.626 m)   Wt 101.2 kg   LMP 11/11/2022   SpO2 96%   BMI 38.28 kg/m  EFM: 165bpm/moderate/+accels, variable/late decels with pushing CVE: Dilation: 10 Dilation Complete Date: 08/27/23 Dilation Complete Time: 0415 Effacement (%): 100 Cervical Position: Posterior Station: Plus 1 Presentation: Vertex Exam by:: Andrey Campanile, RN  A&P: 27 y.o. G2P0010 [redacted]w[redacted]d here for IOL 2/2 pdIOL #Labor: Second stage of labor began at 73. Previously pushing on her side with back discomfort now she is pushing on her back. Moderate meconium on exam. Little movement with pushing at this time. Consider re-evaluating improvement of station in 39min-1hour or sooner if necessary. EFW @36wks  46% 6lbs 1oz #Pain: Epidural #FWB: Cat II, moderate mec will make delivery call overall reassuring at this time with moderate variability in between pushes #GBS negative   Dearion Huot Autry-Lott, DO 6:17 AM

## 2023-08-28 DIAGNOSIS — Z8659 Personal history of other mental and behavioral disorders: Secondary | ICD-10-CM

## 2023-08-28 DIAGNOSIS — D62 Acute posthemorrhagic anemia: Secondary | ICD-10-CM | POA: Diagnosis not present

## 2023-08-28 LAB — CBC
HCT: 24.6 % — ABNORMAL LOW (ref 36.0–46.0)
Hemoglobin: 8 g/dL — ABNORMAL LOW (ref 12.0–15.0)
MCH: 30.7 pg (ref 26.0–34.0)
MCHC: 32.5 g/dL (ref 30.0–36.0)
MCV: 94.3 fL (ref 80.0–100.0)
Platelets: 188 10*3/uL (ref 150–400)
RBC: 2.61 MIL/uL — ABNORMAL LOW (ref 3.87–5.11)
RDW: 14.2 % (ref 11.5–15.5)
WBC: 18.3 10*3/uL — ABNORMAL HIGH (ref 4.0–10.5)
nRBC: 0 % (ref 0.0–0.2)

## 2023-08-28 MED ORDER — ACETAMINOPHEN 500 MG PO TABS
1000.0000 mg | ORAL_TABLET | Freq: Four times a day (QID) | ORAL | Status: DC
  Administered 2023-08-28 – 2023-08-29 (×4): 1000 mg via ORAL
  Filled 2023-08-28 (×4): qty 2

## 2023-08-28 MED ORDER — OXYCODONE HCL 5 MG PO TABS
5.0000 mg | ORAL_TABLET | Freq: Four times a day (QID) | ORAL | Status: DC | PRN
  Administered 2023-08-28: 5 mg via ORAL
  Filled 2023-08-28: qty 1

## 2023-08-28 MED ORDER — LAMOTRIGINE 150 MG PO TABS: 150.0000 mg | ORAL_TABLET | Freq: Every day | ORAL | Status: DC

## 2023-08-28 MED ORDER — OXYCODONE-ACETAMINOPHEN 5-325 MG PO TABS: 1.0000 | ORAL_TABLET | Freq: Four times a day (QID) | ORAL | Status: DC | PRN

## 2023-08-28 NOTE — Lactation Note (Signed)
This note was copied from a baby's chart. Lactation Consultation Note  Patient Name: Sherri Barton ZOXWR'U Date: 08/28/2023 Age:29 hours Reason for consult: Follow-up assessment;Term;Mother's request  Lc visited with P1 parent for follow up consult. MOB began supplementing with donor milk as she feels she is not making enough milk for infant and the latch is difficult. MOB recently fed infant donor breastmilk, denied latch assistance but wanted help with pumping/hand expression as she feels she is not doing it "right". LC reviewed intake guidelines and encouraged MOB to pump to stimulate milk supply. MOB has easily expressed colostrum by hand out of right breast, but harder to collect on left breast due to swelling. LC taught reverse pressure softening and parent pumped using DEBP (collected droplets - but hand expressed 2ml mid-pumping session).   Feeding Plan 1) Breastfeed infant skin to skin every 2-3 hours or sooner on demand If infant cues. 2) Pump using DEBP and feed back colostrum to infant  3) Supplement using donor milk according to supplementation guidelines. 4) Call RN/LC for breastfeeding assistance.   Feeding Mother's Current Feeding Choice: Breast Milk and Donor Milk Nipple Type: Slow - flow Lactation Tools Discussed/Used Tools: Pump;Flanges Flange Size: 21;18 Breast pump type: Double-Electric Breast Pump Pump Education: Setup, frequency, and cleaning;Milk Storage Reason for Pumping: establish supply Pumping frequency: q 3 hours Pumped volume: 2 mL  Interventions Interventions: Breast feeding basics reviewed;DEBP;Education;CDC Guidelines for Breast Pump Cleaning;LC Services brochure;Guidelines for Milk Supply and Pumping Schedule Handout  Discharge    Consult Status Consult Status: Follow-up Date: 08/29/23 Follow-up type: In-patient    Sherri Barton 08/28/2023, 12:55 PM

## 2023-08-28 NOTE — Lactation Note (Signed)
This note was copied from a baby's chart. Lactation Consultation Note  Patient Name: Sherri Barton Date: 08/28/2023 Age:29 hours Reason for consult: Follow-up assessment;Primapara;1st time breastfeeding;Term;Breastfeeding assistance;Mother's request  P1- Reception called LC and stated that MOB requested assistance with feeding. FOB had already fed infant 5 mL DBM when LC entered room. LC assisted MOB with placing infant on the right breast in the football hold. Infant was able to latch, but would push away after a few sucks. MOB states that she thinks the infant is used to the bottle/flow of the bottle, so she is refusing MOB's breast. LC attempted stimulation infant into nursing on the breast for 10 min, before talking to MOB about a nipple shield for extra stimulation. MOB agreed.  LC provided MOB with a 24 mm nipple shield and demonstrated how to properly use it. LC encouraged MOB to always try without the shield first, and use it as a last resort. LC then placed about 1.5 mL DBM in the shield and latched infant. Infant immediately took to the shield and nursed for 5 minutes. Infant then fell asleep despite stimulation. FOB then took infant to finish the DBM bottle before she was too asleep.   LC reviewed breast care and how to latch infant deeply. LC encouraged MOB to call for further assistance as needed.   Maternal Data Has patient been taught Hand Expression?: Yes Does the patient have breastfeeding experience prior to this delivery?: No  Feeding Mother's Current Feeding Choice: Breast Milk and Donor Milk Nipple Type: Nfant Standard Flow (white)  LATCH Score Latch: Repeated attempts needed to sustain latch, nipple held in mouth throughout feeding, stimulation needed to elicit sucking reflex.  Audible Swallowing: A few with stimulation  Type of Nipple: Everted at rest and after stimulation  Comfort (Breast/Nipple): Soft / non-tender  Hold (Positioning): Full assist,  staff holds infant at breast  LATCH Score: 6   Lactation Tools Discussed/Used Tools: Pump;Flanges;Coconut oil;Nipple Shields Nipple shield size: 24 Flange Size: 21 Breast pump type: Double-Electric Breast Pump;Manual Pump Education: Setup, frequency, and cleaning;Milk Storage  Interventions Interventions: Breast feeding basics reviewed;Assisted with latch;Hand express;Breast compression;Adjust position;Support pillows;Position options;Expressed milk;Coconut oil;Education;Pace feeding;LC Services brochure  Discharge Discharge Education: Engorgement and breast care;Warning signs for feeding baby;Outpatient recommendation  Consult Status Consult Status: Follow-up Date: 08/29/23 Follow-up type: In-patient    Dema Severin BS, IBCLC 08/28/2023, 5:53 PM

## 2023-08-28 NOTE — Anesthesia Postprocedure Evaluation (Signed)
Anesthesia Post Note  Patient: Sherri Barton  Procedure(s) Performed: AN AD HOC LABOR EPIDURAL     Patient location during evaluation: Mother Baby Anesthesia Type: Epidural Level of consciousness: awake and alert and oriented Pain management: satisfactory to patient Vital Signs Assessment: post-procedure vital signs reviewed and stable Respiratory status: respiratory function stable Cardiovascular status: stable Postop Assessment: no headache, no backache, epidural receding, patient able to bend at knees, no signs of nausea or vomiting, adequate PO intake and able to ambulate Anesthetic complications: no   No notable events documented.  Last Vitals:  Vitals:   08/27/23 2306 08/28/23 0540  BP: 106/84 (!) 89/52  Pulse: (!) 105 66  Resp: 18 18  Temp: 36.7 C 36.4 C  SpO2:      Last Pain:  Vitals:   08/28/23 1126  TempSrc:   PainSc: 4    Pain Goal: Patients Stated Pain Goal: 2 (08/27/23 1130)                 Carlos American

## 2023-08-28 NOTE — Progress Notes (Signed)
CSW acknowledged consult and complete chart review.  CSW attempted to meet with MOB in room 417.  When CSW arrived, MOB has several room guest and requested CSW to return at a later time; CSW agreed.   Blaine Hamper, MSW, LCSW Clinical Social Work [phone]

## 2023-08-28 NOTE — Discharge Summary (Signed)
VAVD OB Discharge Summary       Patient Name: Sherri Barton DOB: [birth year] MRN: [id number]  Date of admission: 08/25/2023 Delivering MD: Lavonda Jumbo Date of delivery: 08/27/2023 Type of delivery: VAVD  Newborn Data: Sex: Baby female Live born female  Birth Weight: 8 lb 9.6 oz (3900 g) APGAR: 8, 9  Newborn Delivery   Birth date/time: 08/27/2023 07:48:27 Delivery type: Vaginal, Vacuum (Extractor)     Feeding: breast and bottle Infant being discharge to home with mother in stable condition.   Admitting diagnosis: Post-dates pregnancy [O48.0] Intrauterine pregnancy: [redacted]w[redacted]d     Secondary diagnosis:  Principal Problem:   Post-dates pregnancy Active Problems:   Vacuum-assisted vaginal delivery   Postpartum hemorrhage   Anemia due to acute blood loss   H/O bipolar disorder                                Complications: Hemorrhage>1073mL and ROM>24 hours                                                              Intrapartum Procedures: vacuum Postpartum Procedures: transfusion IV Iron Complications-Operative and Postpartum:  2nd degree perineal laceration Augmentation: AROM, Pitocin, and Cytotec   History of Present Illness: Ms. Sherri Barton is a 27 y.o. female, G2P1011, who presents at [redacted]w[redacted]d weeks gestation. The patient has been followed at  [facility]  Her pregnancy has been complicated by:  Patient Active Problem List   Diagnosis Date Noted   Vacuum-assisted vaginal delivery 08/28/2023   Postpartum hemorrhage 08/28/2023   Anemia due to acute blood loss 08/28/2023   H/O bipolar disorder 08/28/2023   Post-dates pregnancy 08/25/2023   Placenta previa antepartum 04/21/2023   Obesity affecting pregnancy 04/21/2023     Active Ambulatory Problems    Diagnosis Date Noted   Placenta previa antepartum 04/21/2023   Obesity affecting pregnancy 04/21/2023   Resolved Ambulatory Problems    Diagnosis Date Noted    Hemoperitoneum due to rupture of right tubal ectopic pregnancy 04/28/2016   Past Medical History:  Diagnosis Date   Medical history non-contributory      Hospital course:  Induction of Labor With Vaginal Delivery   27 y.o. yo G2P1011 at [redacted]w[redacted]d was admitted to the hospital 08/25/2023 for induction of labor.  Indication for induction: Postdates.  Patient had an labor course complicated bypt was admitted on 10/9 for IOL for postdates at 41 weeks, with h/o bipolar on lamictal 150mg , stable mood, pt progressed with cytotec, pitocin and AROM >24 hours, afebrils no abx needed, had VAVD for NRFHT, with PPH of ebl with TXA x2 , hgb drop of 10.7-8 acute blood loss significant to stay, IV Iron on 10/11 declined BT, had baby female and remains stable.  Membrane Rupture Time/Date: 12:45 PM,08/26/2023  Delivery Method:Vaginal, Vacuum (Extractor) Operative Delivery:Device used:Kiwi x4 pop offs and bell x1 Indication: Fetal indications Episiotomy: Right Mediolateral Lacerations:  2nd degree Details of delivery can be found in separate delivery note.  Patient had a postpartum course complicated by none. Mood remains stable continue on lamictal 150mg  will have 2 weeks mood check with [facility]. Patient is discharged home 08/29/23.  Newborn Data: Birth date:08/27/2023 Birth time:7:48  AM Gender:Female Living status:Living Apgars:8 ,9  Weight:3900 g Postpartum Day # 2 : Patient up ad lib, denies syncope or dizziness. Reports consuming regular diet without issues and denies N/V. Patient reports 0 bowel movement + passing flatus.  Denies issues with urination and reports bleeding is "lighter."  Patient is breast and bottle feeding and reports going well.  Desires undecided for postpartum contraception.  Pain Is being appropriately managed with use of po meds.   Physical exam  Vitals:   08/28/23 0540 08/28/23 1533 08/28/23 2017 08/29/23 0515  BP: (!) 89/52 (!) 111/54 118/66 (!) 99/59  Pulse: 66 88 83 78   Resp: 18 19 18 18   Temp: 97.6 F (36.4 C) (!) 97.5 F (36.4 C) 98.4 F (36.9 C) 97.6 F (36.4 C)  TempSrc: Oral Oral Oral Oral  SpO2:  99%    Weight:      Height:       General: alert, cooperative, and no distress Lochia: appropriate Uterine Fundus: firm Incision: Healing well with no significant drainage, No significant erythema, Dressing is clean, dry, and intact, honeycomb dressing CDI Perineum: approximate DVT Evaluation: No evidence of DVT seen on physical exam. Negative Homan's sign. No cords or calf tenderness. No significant calf/ankle edema.  Labs: Lab Results  Component Value Date   WBC 18.3 (H) 08/28/2023   HGB 8.0 (L) 08/28/2023   HCT 24.6 (L) 08/28/2023   MCV 94.3 08/28/2023   PLT 188 08/28/2023       No data to display          Date of discharge: 08/29/2023 Discharge Diagnoses: Post-date pregnancy Discharge instruction: per After Visit Summary and "Baby and Me Booklet".  After visit meds:   Activity:           unrestricted and pelvic rest Advance as tolerated. Pelvic rest for 6 weeks.  Diet:                routine Medications: PNV, Ibuprofen, Iron, and lamictal  Postpartum contraception: Undecided Condition:  Pt discharge to home with baby in stable condition   Meds: Allergies as of 08/29/2023       Reactions   Tri-previfem [norgestim-eth Estrad Triphasic] Hives, Swelling   Augmentin [amoxicillin-pot Clavulanate] Rash        Medication List     TAKE these medications    ferrous sulfate 325 (65 FE) MG tablet Take 325 mg by mouth daily.   ibuprofen 600 MG tablet Commonly known as: ADVIL Take 1 tablet (600 mg total) by mouth every 6 (six) hours.   lamoTRIgine 150 MG tablet Commonly known as: LAMICTAL Take 1 tablet (150 mg total) by mouth daily. What changed: Another medication with the same name was removed. Continue taking this medication, and follow the directions you see here.   omeprazole 20 MG capsule Commonly known as:  PRILOSEC Take 20 mg by mouth daily.   PRENATAL PO Take 1 tablet by mouth daily.        Discharge Follow Up:   Follow-up Information     [facility] Follow up.   Specialty: Obstetrics and Gynecology Why: 2 weeks mood check and 6 weeks PPV Contact information: [address] [place] [address] [phone]        [facility] .   Specialty: Radiology Contact information: [address] [place] [address] [place] [address] [phone]  [facility] CNM, FNP-C, PMHNP-BC  [address]  [place], [place] [address]  Cell: [phone]  Office Phone: [phone] Fax: [phone] 08/29/2023  7:34 AM

## 2023-08-28 NOTE — Progress Notes (Signed)
PPD# 1 SVD w/ 2nd degree perineal Information for the patient's newborn:  Sherri Barton, Sherri Barton [[id number]]  female  Baby Name [facility] Circumcision N/A   S:   Reports feeling good, perineum is sore Tolerating PO fluid and solids No nausea or vomiting Bleeding is light, no clots Pain is somewhat controlled with acetaminophen and ibuprofen (OTC), Tylenol changed from PRN to scheduled and percocet added Up ad lib / ambulatory / voiding w/o difficulty Feeding: Breast    O:   VS: BP (!) 89/52   Pulse 66   Temp 97.6 F (36.4 C) (Oral)   Resp 18   Ht 5\' 4"  (1.626 m)   Wt 101.2 kg   LMP 11/11/2022   SpO2 100%   Breastfeeding Unknown   BMI 38.28 kg/m   LABS:  Recent Labs    08/27/23 0828 08/28/23 0457  WBC 21.8* 18.3*  HGB 10.7* 8.0*  PLT 186 188   Blood type: --/--/A POS (10/09 2155) Rubella: Immune (04/02 0000)                      I&O: Intake/Output      10/11 0701 10/12 0700 10/12 0701 10/13 0700   Urine (mL/kg/hr) 1500 (0.6)    Blood 1298    Total Output 2798    Net -2798         Urine Occurrence 1 x      Physical Exam: Alert and oriented X3 Lungs: Clear and unlabored Heart: regular rate and rhythm / no mumurs Abdomen: soft, non-tender, non-distended  Fundus: firm, non-tender, U-2 Perineum: well-approximated Lochia: appropriate Extremities: +1, non-pitting edema, negative for calf pain, tenderness, or cords    A:  PPD # 1  Normal exam  P:  Routine postpartum orders Anticipate D/C on PP day 2 Plan reviewed w/ Dr. Erik Obey, DNP, CNM 08/28/2023, 7:50 AM

## 2023-08-29 MED ORDER — IBUPROFEN 600 MG PO TABS: 600.0000 mg | ORAL_TABLET | Freq: Four times a day (QID) | ORAL | 0 refills | Status: AC

## 2023-08-29 MED ORDER — LAMOTRIGINE 150 MG PO TABS: 150.0000 mg | ORAL_TABLET | Freq: Every day | ORAL | 0 refills | Status: AC

## 2023-08-29 NOTE — Clinical Social Work Maternal (Signed)
CLINICAL SOCIAL WORK MATERNAL/CHILD NOTE  Patient Details  Name: Sherri Barton MRN: [id number] Date of Birth: 06/05/1996  Date:  September 04, 2023  Clinical Social Worker Initiating Note:  [place], [place] Date/Time: Initiated:  08/29/23/1250     Child's Name:  Sherri Barton   Biological Parents:  Mother, Father (Father: Sherri Barton)   Need for Interpreter:  None   Reason for Referral:  Behavioral Health Concerns   Address:  [address] [place] [address]    Phone number:  [phone] (home)     Additional phone number:   Household Members/Support Persons (HM/SP):   Household Member/Support Person 1, Household Member/Support Person 2   HM/SP Name Relationship DOB or Age  HM/SP -1 Sherri Barton FOB    HM/SP -2   stepdaughter 63 years old  HM/SP -3        HM/SP -4        HM/SP -5        HM/SP -6        HM/SP -7        HM/SP -8          Natural Supports (not living in the home):  Parent   Professional Supports: Therapist   Employment: Futures trader   Type of Work:     Education:  Counsellor arranged:    Surveyor, quantity Resources:  Media planner    Other Resources:  [facility]   Cultural/Religious Considerations Which May Impact Care:    Strengths:  Ability to meet basic needs  , Merchandiser, retail, Home prepared for child  , Psychotropic Medications   Psychotropic Medications:  Lamictal      Pediatrician:    [place] (including [place])  Pediatrician List:   [place]    [place] Other ([facility] [place])    Pediatrician Fax Number:    Risk Factors/Current Problems:  Mental Health Concerns     Cognitive State:  Able to Concentrate  , Alert  , Insightful  , Goal Oriented     Mood/Affect:  Calm  , Happy  , Interested  , Comfortable  , Relaxed     CSW Assessment: CSW met with MOB at bedside to complete psychosocial  assessment. CSW introduced self and explained reason for consult. MOB was welcoming, pleasant, and remained engaged during assessment. MOB reported that she resides with FOB and stepdaughter. MOB reported having all items needed to care for infant including a car seat and crib. CSW inquired about MOB's support system, MOB reported that FOB and her parents are supports.   CSW inquired about MOB's mental health history. MOB reported that she was diagnosed with Bipolar, Anxiety, and Depression in 2021. MOB denied any current symptoms and reported that she is currently prescribed/taking Lamictal which is managed by her Psychiatrist at the [facility] in [place]. MOB reported that her medication is helpful. MOB reported that she also participates in therapy which is helpful. MOB reported that she has a therapy appointment scheduled for tomorrow. CSW inquired about how MOB was feeling emotionally since giving birth, MOB reported that she was feeling great. MOB presented calm and did not demonstrate any acute mental health signs/symptoms. CSW assessed for safety, MOB denied SI, HI, and domestic violence.   CSW provided education regarding the baby blues period vs. perinatal mood disorders, discussed treatment and gave resources for mental health follow  up if concerns arise.  CSW recommends self-evaluation during the postpartum time period using the New Mom Checklist from Postpartum Progress and encouraged MOB to contact a medical professional if symptoms are noted at any time.    CSW provided review of Sudden Infant Death Syndrome (SIDS) precautions.    CSW asked if MOB needed any resources/supports, MOB reported no needs.   CSW identifies no further need for intervention and no barriers to discharge at this time.   CSW Plan/Description:  Sudden Infant Death Syndrome (SIDS) Education, Perinatal Mood and Anxiety Disorder (PMADs) Education, No Further Intervention Required/No Barriers to  Discharge    Antionette Poles, LCSW 27-Jul-2023, 12:53 PM

## 2023-08-29 NOTE — Lactation Note (Signed)
This note was copied from a baby's chart. Lactation Consultation Note  Patient Name: Girl Keaisha Sublette [id number] Date: 08/29/2023 Age:29 hours Reason for consult: Follow-up assessment  P1, Assisted mother with pumping with DEBP fitted with 21 mm flanges. Suggest mother check flange size after milk transitions.  Mother states they have not been using NS to latch and have been primarily bottle feeding donor milk. Mother has expressed drops been pumping. Reviewed engorgement care and monitoring voids/stools. Suggest making OP appt.  Maternal Data Does the patient have breastfeeding experience prior to this delivery?: No  Feeding Mother's Current Feeding Choice: Breast Milk and Donor Milk Nipple Type: Nfant Standard Flow (white)  Lactation Tools Discussed/Used Tools: Pump;Flanges;Nipple Shields Nipple shield size: 24 Flange Size: 21 Breast pump type: Double-Electric Breast Pump;Manual Reason for Pumping: stimulation and supplementation Pumping frequency: q 3 hours for 15 min  Interventions Interventions: Education;DEBP  Discharge Discharge Education: Engorgement and breast care;Warning signs for feeding baby Pump: Personal;DEBP (Spectra)  Consult Status Consult Status: Complete Date: 08/29/23    Dahlia Byes [facility] 08/29/2023, 1:13 PM

## 2023-08-30 LAB — SURGICAL PATHOLOGY

## 2023-09-21 ENCOUNTER — Telehealth (HOSPITAL_COMMUNITY): Payer: Self-pay | Admitting: *Deleted

## 2023-09-21 NOTE — Telephone Encounter (Signed)
09/21/2023  Name: Sherri Barton MRN: [id number] DOB: 30-Apr-1996  Reason for Call:  Transition of Care Hospital Discharge Call  Contact Status: Patient Contact Status: Message  Language assistant needed:          Follow-Up Questions:    [name] Postnatal Depression Scale:  In the Past 7 Days:    PHQ2-9 Depression Scale:     Discharge Follow-up:    Post-discharge interventions: NA  [name],RN  09/21/2023 [id number]
